# Patient Record
Sex: Female | Born: 1985
Health system: Southern US, Community
[De-identification: ages and names within clinical notes are randomized; demographics above are authoritative.]

## PROBLEM LIST (undated history)

## (undated) DIAGNOSIS — R000 Tachycardia, unspecified: Secondary | ICD-10-CM

## (undated) DIAGNOSIS — J302 Other seasonal allergic rhinitis: Secondary | ICD-10-CM

## (undated) DIAGNOSIS — K589 Irritable bowel syndrome without diarrhea: Secondary | ICD-10-CM

## (undated) DIAGNOSIS — B019 Varicella without complication: Secondary | ICD-10-CM

## (undated) DIAGNOSIS — N809 Endometriosis, unspecified: Secondary | ICD-10-CM

## (undated) DIAGNOSIS — K219 Gastro-esophageal reflux disease without esophagitis: Secondary | ICD-10-CM

## (undated) DIAGNOSIS — G43909 Migraine, unspecified, not intractable, without status migrainosus: Secondary | ICD-10-CM

## (undated) HISTORY — DX: Tachycardia, unspecified: R00.0

## (undated) HISTORY — DX: Gastro-esophageal reflux disease without esophagitis: K21.9

## (undated) HISTORY — DX: Endometriosis, unspecified: N80.9

## (undated) HISTORY — DX: Migraine, unspecified, not intractable, without status migrainosus: G43.909

## (undated) HISTORY — DX: Varicella without complication: B01.9

## (undated) HISTORY — DX: Irritable bowel syndrome, unspecified: K58.9

---

## 2006-01-10 HISTORY — PX: OTHER SURGICAL HISTORY: SHX169

## 2013-10-09 HISTORY — PX: APPENDECTOMY: SHX54

## 2014-07-13 ENCOUNTER — Ambulatory Visit: Payer: Self-pay | Admitting: Physician Assistant

## 2014-07-13 LAB — RAPID STREP-A WITH REFLX: MICRO TEXT REPORT: NEGATIVE

## 2014-07-16 LAB — BETA STREP CULTURE(ARMC)

## 2014-12-13 ENCOUNTER — Ambulatory Visit: Payer: Self-pay | Admitting: Nurse Practitioner

## 2015-01-01 ENCOUNTER — Ambulatory Visit: Payer: Self-pay | Admitting: Urgent Care

## 2015-02-22 NOTE — Discharge Instructions (Signed)

## 2015-02-25 ENCOUNTER — Ambulatory Visit: Payer: BLUE CROSS/BLUE SHIELD | Admitting: Anesthesiology

## 2015-02-25 ENCOUNTER — Other Ambulatory Visit: Payer: Self-pay | Admitting: Gastroenterology

## 2015-02-25 ENCOUNTER — Encounter
Admission: RE | Disposition: A | Discharge status: Home / Self Care | Payer: Self-pay | Source: Ambulatory Visit | Attending: Gastroenterology

## 2015-02-25 ENCOUNTER — Encounter: Payer: Self-pay | Admitting: Gastroenterology

## 2015-02-25 ENCOUNTER — Ambulatory Visit
Admission: RE | Admit: 2015-02-25 | Discharge: 2015-02-25 | Disposition: A | Discharge status: Home / Self Care | Payer: BLUE CROSS/BLUE SHIELD | Source: Ambulatory Visit | Attending: Gastroenterology | Admitting: Gastroenterology

## 2015-02-25 DIAGNOSIS — R51 Headache: Secondary | ICD-10-CM | POA: Diagnosis not present

## 2015-02-25 DIAGNOSIS — K2950 Unspecified chronic gastritis without bleeding: Secondary | ICD-10-CM | POA: Diagnosis not present

## 2015-02-25 DIAGNOSIS — K219 Gastro-esophageal reflux disease without esophagitis: Secondary | ICD-10-CM | POA: Diagnosis not present

## 2015-02-25 DIAGNOSIS — K2980 Duodenitis without bleeding: Secondary | ICD-10-CM | POA: Diagnosis not present

## 2015-02-25 DIAGNOSIS — Z79899 Other long term (current) drug therapy: Secondary | ICD-10-CM | POA: Diagnosis not present

## 2015-02-25 DIAGNOSIS — R110 Nausea: Secondary | ICD-10-CM | POA: Diagnosis present

## 2015-02-25 HISTORY — DX: Other seasonal allergic rhinitis: J30.2

## 2015-02-25 HISTORY — DX: Gastro-esophageal reflux disease without esophagitis: K21.9

## 2015-02-25 HISTORY — PX: ESOPHAGOGASTRODUODENOSCOPY: SHX5428

## 2015-02-25 SURGERY — COLONOSCOPY
Anesthesia: Choice

## 2015-02-25 SURGERY — EGD (ESOPHAGOGASTRODUODENOSCOPY)
Anesthesia: Monitor Anesthesia Care | Wound class: Clean Contaminated

## 2015-02-25 MED ORDER — OXYCODONE HCL 5 MG/5ML PO SOLN: 5.0000 mg | Freq: Once | ORAL | Status: DC | PRN

## 2015-02-25 MED ORDER — OXYCODONE HCL 5 MG PO TABS: 5.0000 mg | ORAL_TABLET | Freq: Once | ORAL | Status: DC | PRN

## 2015-02-25 MED ORDER — DEXAMETHASONE SODIUM PHOSPHATE 4 MG/ML IJ SOLN: 8.0000 mg | Freq: Once | INTRAMUSCULAR | Status: DC | PRN

## 2015-02-25 MED ORDER — LIDOCAINE HCL (CARDIAC) 20 MG/ML IV SOLN
INTRAVENOUS | Status: DC | PRN
  Administered 2015-02-25: 50 mg via INTRAVENOUS

## 2015-02-25 MED ORDER — FENTANYL CITRATE (PF) 100 MCG/2ML IJ SOLN: 25.0000 ug | INTRAMUSCULAR | Status: DC | PRN

## 2015-02-25 MED ORDER — PROPOFOL 10 MG/ML IV BOLUS
INTRAVENOUS | Status: DC | PRN
  Administered 2015-02-25: 100 mg via INTRAVENOUS
  Administered 2015-02-25 (×2): 50 mg via INTRAVENOUS

## 2015-02-25 MED ORDER — ACETAMINOPHEN 325 MG PO TABS: 325.0000 mg | ORAL_TABLET | ORAL | Status: DC | PRN

## 2015-02-25 MED ORDER — LACTATED RINGERS IV SOLN
INTRAVENOUS | Status: DC
  Administered 2015-02-25: 08:00:00 via INTRAVENOUS

## 2015-02-25 MED ORDER — SODIUM CHLORIDE 0.9 % IV SOLN: INTRAVENOUS | Status: DC

## 2015-02-25 MED ORDER — ACETAMINOPHEN 160 MG/5ML PO SOLN
325.0000 mg | ORAL | Status: DC | PRN
Start: 2015-02-25 — End: 2015-02-25

## 2015-02-25 SURGICAL SUPPLY — 38 items
BALLN DILATOR 10-12 8 (BALLOONS)
BALLN DILATOR 12-15 8 (BALLOONS)
BALLN DILATOR 15-18 8 (BALLOONS)
BALLN DILATOR CRE 0-12 8 (BALLOONS)
BALLN DILATOR ESOPH 8 10 CRE (MISCELLANEOUS) IMPLANT
BALLOON DILATOR 12-15 8 (BALLOONS) IMPLANT
BALLOON DILATOR 15-18 8 (BALLOONS) IMPLANT
BALLOON DILATOR CRE 0-12 8 (BALLOONS) IMPLANT
BLOCK BITE 60FR ADLT L/F GRN (MISCELLANEOUS) ×2 IMPLANT
CANISTER SUCT 1200ML W/VALVE (MISCELLANEOUS) ×2 IMPLANT
FCP ESCP3.2XJMB 240X2.8X (MISCELLANEOUS)
FORCEPS BIOP RAD 4 LRG CAP 4 (CUTTING FORCEPS) ×2 IMPLANT
FORCEPS BIOP RJ4 240 W/NDL (MISCELLANEOUS)
FORCEPS ESCP3.2XJMB 240X2.8X (MISCELLANEOUS) IMPLANT
GOWN CVR UNV OPN BCK APRN NK (MISCELLANEOUS) ×2 IMPLANT
GOWN ISOL THUMB LOOP REG UNIV (MISCELLANEOUS) ×2
HEMOCLIP INSTINCT (CLIP) IMPLANT
INJECTOR VARIJECT VIN23 (MISCELLANEOUS) IMPLANT
KIT CO2 TUBING (TUBING) IMPLANT
KIT DEFENDO VALVE AND CONN (KITS) IMPLANT
KIT ENDO PROCEDURE OLY (KITS) ×2 IMPLANT
LIGATOR MULTIBAND 6SHOOTER MBL (MISCELLANEOUS) IMPLANT
MARKER SPOT ENDO TATTOO 5ML (MISCELLANEOUS) IMPLANT
PAD GROUND ADULT SPLIT (MISCELLANEOUS) IMPLANT
SNARE SHORT THROW 13M SML OVAL (MISCELLANEOUS) IMPLANT
SNARE SHORT THROW 30M LRG OVAL (MISCELLANEOUS) IMPLANT
SPOT EX ENDOSCOPIC TATTOO (MISCELLANEOUS)
SUCTION POLY TRAP 4CHAMBER (MISCELLANEOUS) IMPLANT
SYR INFLATION 60ML (SYRINGE) IMPLANT
TRAP SUCTION POLY (MISCELLANEOUS) IMPLANT
TUBING CONN 6MMX3.1M (TUBING)
TUBING SUCTION CONN 0.25 STRL (TUBING) IMPLANT
UNDERPAD 30X60 958B10 (PK) (MISCELLANEOUS) IMPLANT
VALVE BIOPSY ENDO (VALVE) IMPLANT
VARIJECT INJECTOR VIN23 (MISCELLANEOUS)
WATER AUXILLARY (MISCELLANEOUS) IMPLANT
WATER STERILE IRR 500ML POUR (IV SOLUTION) ×2 IMPLANT
WIRE CRE 18-20MM 8CM F G (MISCELLANEOUS) IMPLANT

## 2015-02-25 NOTE — Anesthesia Procedure Notes (Signed)
Procedure Name: MAC Performed by: Celise Bazar Pre-anesthesia Checklist: Patient identified, Emergency Drugs available, Suction available, Timeout performed and Patient being monitored Patient Re-evaluated:Patient Re-evaluated prior to inductionOxygen Delivery Method: Nasal cannula Placement Confirmation: positive ETCO2     

## 2015-02-25 NOTE — Anesthesia Preprocedure Evaluation (Addendum)
Anesthesia Evaluation  Patient identified by MRN, date of birth, ID band Patient awake    Reviewed: Allergy & Precautions, H&P , NPO status , Patient's Chart, lab work & pertinent test results, reviewed documented beta blocker date and time   Airway Mallampati: II  TM Distance: >3 FB Neck ROM: full    Dental  (+) Chipped Chipped front upper left tooth:   Pulmonary neg pulmonary ROS,  breath sounds clear to auscultation  Pulmonary exam normal       Cardiovascular Exercise Tolerance: Good negative cardio ROS  Rhythm:regular Rate:Normal     Neuro/Psych  Headaches, negative psych ROS   GI/Hepatic Neg liver ROS, GERD-  Medicated and Controlled,  Endo/Other  negative endocrine ROS  Renal/GU negative Renal ROS  negative genitourinary   Musculoskeletal   Abdominal   Peds  Hematology negative hematology ROS (+)   Anesthesia Other Findings   Reproductive/Obstetrics negative OB ROS                            Anesthesia Physical Anesthesia Plan  ASA: II  Anesthesia Plan: MAC   Post-op Pain Management:    Induction:   Airway Management Planned:   Additional Equipment:   Intra-op Plan:   Post-operative Plan:   Informed Consent: I have reviewed the patients History and Physical, chart, labs and discussed the procedure including the risks, benefits and alternatives for the proposed anesthesia with the patient or authorized representative who has indicated his/her understanding and acceptance.   Dental Advisory Given  Plan Discussed with: CRNA  Anesthesia Plan Comments:         Anesthesia Quick Evaluation

## 2015-02-25 NOTE — Op Note (Signed)
[facility] Gastroenterology Patient Name: Megan StandsMeris Knippenberg Procedure Date: 02/25/2015 8:35 AM MRN: [id number] Account #: [id number] Date of Birth: 04/02/1986 Admit Type: Outpatient Age: [age] [facility] Gender: Female Note Status: Finalized Procedure:         Upper GI endoscopy Indications:       Epigastric abdominal pain, Dyspepsia, Heartburn Providers:         Midge Miniumarren Tisha Cline, MD Referring MD:      Barbette Merinorystal M. King (Referring MD) Medicines:         Propofol per Anesthesia Complications:     No immediate complications. Procedure:         Pre-Anesthesia Assessment:                    - Prior to the procedure, a History and Physical was                     performed, and patient medications and allergies were                     reviewed. The patient's tolerance of previous anesthesia                     was also reviewed. The risks and benefits of the procedure                     and the sedation options and risks were discussed with the                     patient. All questions were answered, and informed consent                     was obtained. Prior Anticoagulants: The patient has taken                     no previous anticoagulant or antiplatelet agents. ASA                     Grade Assessment: II - A patient with mild systemic                     disease. After reviewing the risks and benefits, the                     patient was deemed in satisfactory condition to undergo                     the procedure.                    After obtaining informed consent, the endoscope was passed                     under direct vision. Throughout the procedure, the                     patient's blood pressure, pulse, and oxygen saturations                     were monitored continuously. The Olympus GIF-HQ190                     Endoscope (S#. [id number]) was introduced through the mouth,  and advanced to the second part of duodenum. The upper GI                   endoscopy was accomplished without difficulty. The patient                     tolerated the procedure well. Findings:      The examined esophagus was normal.      The entire examined stomach was normal. Biopsies were taken with a cold       forceps for Helicobacter pylori testing.      Localized mild inflammation characterized by erosions was found in the       duodenal bulb. Biopsies were taken with a cold forceps for histology. Impression:        - Normal esophagus.                    - Normal stomach. Biopsied.                    - Duodenitis. Biopsied. Recommendation:    - Await pathology results. Procedure Code(s): --- Professional ---                    [phone], Esophagogastroduodenoscopy, flexible, transoral;                     with biopsy, single or multiple Diagnosis Code(s): --- Professional ---                    R10.13, Epigastric pain                    R12, Heartburn                    K29.80, Duodenitis without bleeding CPT copyright 2014 American Medical Association. All rights reserved. The codes documented in this report are preliminary and upon coder review may  be revised to meet current compliance requirements. Midge Miniumarren Alyxandria Wentz, MD 02/25/2015 8:55:17 AM This report has been signed electronically. Number of Addenda: 0 Note Initiated On: 02/25/2015 8:35 AM Total Procedure Duration: 0 hours 2 minutes 28 seconds       [facility]

## 2015-02-25 NOTE — H&P (Signed)
  [facility]  [address] [place], [place] [address] Phone: [phone] Fax : [phone]  Primary Care Physician:  No PCP Per Patient Primary Gastroenterologist:  Dr. Servando SnareWohl  Pre-Procedure History & Physical: HPI:  Megan Norton is a 29 y.o. female is here for an endoscopy.   Past Medical History  Diagnosis Date  . Abdominal pain     Colicky RUQ  . Nausea   . GERD (gastroesophageal reflux disease)     heartburn and indigestion  . Constipation   . Diarrhea   . Headache     hx of migraines/ not as often now/ headaches 2-3 times/week  . Seasonal allergies     Past Surgical History  Procedure Laterality Date  . Appendectomy  10/09/2013  . Bowel obstruction  01/2006    Prior to Admission medications   Medication Sig Start Date End Date Taking? Authorizing Provider  omeprazole (PRILOSEC) 20 MG capsule Take 20 mg by mouth daily. Before breakfast   Yes Historical Provider, MD  ondansetron (ZOFRAN) 8 MG tablet Take by mouth every 8 (eight) hours as needed for nausea or vomiting.   Yes Historical Provider, MD  ranitidine (ZANTAC) 75 MG tablet Take 75 mg by mouth as needed for heartburn.   Yes Historical Provider, MD    Allergies as of 02/15/2015  . (Not on File)    History reviewed. No pertinent family history.  History   Social History  . Marital Status: Married    Spouse Name: N/A  . Number of Children: N/A  . Years of Education: N/A   Occupational History  . Not on file.   Social History Main Topics  . Smoking status: Never Smoker   . Smokeless tobacco: Not on file  . Alcohol Use: No  . Drug Use: No  . Sexual Activity: Not on file   Other Topics Concern  . Not on file   Social History Narrative  . No narrative on file    Review of Systems: See HPI, otherwise negative ROS  Physical Exam: BP 112/74 mmHg  Pulse 73  Temp(Src) 98.1 F (36.7 C) (Temporal)  Resp 16  Ht 5\' 3"  (1.6 m)  Wt 218 lb (98.884 kg)  BMI 38.63 kg/m2   SpO2 100%  LMP 02/14/2015 General:   Alert,  pleasant and cooperative in NAD Head:  Normocephalic and atraumatic. Neck:  Supple; no masses or thyromegaly. Lungs:  Clear throughout to auscultation.    Heart:  Regular rate and rhythm. Abdomen:  Soft, nontender and nondistended. Normal bowel sounds, without guarding, and without rebound.   Neurologic:  Alert and  oriented x4;  grossly normal neurologically.  Impression/Plan: Megan Norton is here for an endoscopy to be performed for GERD, nausea and epigastric pain.  Risks, benefits, limitations, and alternatives regarding  endoscopy have been reviewed with the patient.  Questions have been answered.  All parties agreeable.   [facility],Sami Roes, MD  02/25/2015, 8:39 AM

## 2015-02-25 NOTE — Transfer of Care (Signed)
Immediate Anesthesia Transfer of Care Note  Patient: Megan Norton  Procedure(s) Performed: Procedure(s): ESOPHAGOGASTRODUODENOSCOPY (EGD) (N/A)  Patient Location: PACU  Anesthesia Type: MAC  Level of Consciousness: awake, alert  and patient cooperative  Airway and Oxygen Therapy: Patient Spontanous Breathing and Patient connected to supplemental oxygen  Post-op Assessment: Post-op Vital signs reviewed, Patient's Cardiovascular Status Stable, Respiratory Function Stable, Patent Airway and No signs of Nausea or vomiting  Post-op Vital Signs: Reviewed and stable  Complications: No apparent anesthesia complications

## 2015-02-25 NOTE — Anesthesia Postprocedure Evaluation (Signed)
  Anesthesia Post-op Note  Patient: Megan Norton  Procedure(s) Performed: Procedure(s): ESOPHAGOGASTRODUODENOSCOPY (EGD) (N/A)  Anesthesia type:MAC  Patient location: PACU  Post pain: Pain level controlled  Post assessment: Post-op Vital signs reviewed, Patient's Cardiovascular Status Stable, Respiratory Function Stable, Patent Airway and No signs of Nausea or vomiting  Post vital signs: Reviewed and stable  Last Vitals:  Filed Vitals:   02/25/15 0856  BP:   Pulse: 65  Temp: 35.8 C  Resp: 18    Level of consciousness: awake, alert  and patient cooperative  Complications: No apparent anesthesia complications

## 2016-06-19 ENCOUNTER — Encounter: Payer: Self-pay | Admitting: Family Medicine

## 2016-06-19 ENCOUNTER — Ambulatory Visit (INDEPENDENT_AMBULATORY_CARE_PROVIDER_SITE_OTHER): Payer: BLUE CROSS/BLUE SHIELD | Admitting: Family Medicine

## 2016-06-19 ENCOUNTER — Encounter (INDEPENDENT_AMBULATORY_CARE_PROVIDER_SITE_OTHER): Payer: Self-pay

## 2016-06-19 VITALS — BP 121/80 | HR 75 | Temp 98.4°F | Wt 230.5 lb

## 2016-06-19 DIAGNOSIS — R3589 Other polyuria: Secondary | ICD-10-CM

## 2016-06-19 DIAGNOSIS — Z13220 Encounter for screening for lipoid disorders: Secondary | ICD-10-CM | POA: Diagnosis not present

## 2016-06-19 DIAGNOSIS — R358 Other polyuria: Secondary | ICD-10-CM

## 2016-06-19 DIAGNOSIS — R5382 Chronic fatigue, unspecified: Secondary | ICD-10-CM

## 2016-06-19 LAB — POCT URINALYSIS DIPSTICK
Bilirubin, UA: NEGATIVE
Glucose, UA: NEGATIVE
Ketones, UA: NEGATIVE
LEUKOCYTES UA: NEGATIVE
Nitrite, UA: NEGATIVE
PROTEIN UA: NEGATIVE
Spec Grav, UA: <=1.005
UROBILINOGEN UA: 0.2
pH, UA: 6.5

## 2016-06-19 LAB — T4, FREE: Free T4: 1.1 ng/dL (ref 0.8–1.8)

## 2016-06-19 LAB — CBC
HEMATOCRIT: 36.2 % (ref 35.0–45.0)
HEMOGLOBIN: 11.9 g/dL (ref 11.7–15.5)
MCH: 27.7 pg (ref 27.0–33.0)
MCHC: 32.9 g/dL (ref 32.0–36.0)
MCV: 84.4 fL (ref 80.0–100.0)
MPV: 9.5 fL (ref 7.5–12.5)
Platelets: 342 10*3/uL (ref 140–400)
RBC: 4.29 MIL/uL (ref 3.80–5.10)
RDW: 13.6 % (ref 11.0–15.0)
WBC: 7 10*3/uL (ref 3.8–10.8)

## 2016-06-19 LAB — COMPREHENSIVE METABOLIC PANEL
ALBUMIN: 4.6 g/dL (ref 3.6–5.1)
ALT: 17 U/L (ref 6–29)
AST: 16 U/L (ref 10–30)
Alkaline Phosphatase: 54 U/L (ref 33–115)
BUN: 12 mg/dL (ref 7–25)
CHLORIDE: 102 mmol/L (ref 98–110)
CO2: 24 mmol/L (ref 20–31)
Calcium: 9.6 mg/dL (ref 8.6–10.2)
Creat: 0.64 mg/dL (ref 0.50–1.10)
GLUCOSE: 98 mg/dL (ref 65–99)
POTASSIUM: 4 mmol/L (ref 3.5–5.3)
Sodium: 138 mmol/L (ref 135–146)
Total Bilirubin: 0.4 mg/dL (ref 0.2–1.2)
Total Protein: 7.9 g/dL (ref 6.1–8.1)

## 2016-06-19 LAB — SEDIMENTATION RATE: Sed Rate: 8 mm/hr (ref 0–20)

## 2016-06-19 LAB — LIPID PANEL
CHOL/HDL RATIO: 3.9 ratio (ref <=5.0)
CHOLESTEROL: 153 mg/dL (ref 125–200)
HDL: 39 mg/dL — AB (ref >=46)
LDL Cholesterol: 95 mg/dL (ref <130)
TRIGLYCERIDES: 96 mg/dL (ref <150)
VLDL: 19 mg/dL (ref <30)

## 2016-06-19 LAB — TSH: TSH: 4.97 mIU/L — ABNORMAL HIGH

## 2016-06-19 LAB — C-REACTIVE PROTEIN: CRP: 0.5 mg/dL (ref <0.60)

## 2016-06-19 LAB — T3, FREE: T3, Free: 3.2 pg/mL (ref 2.3–4.2)

## 2016-06-19 NOTE — Patient Instructions (Signed)
We will call with your results.  Follow up to be determined based on results.  Take care  Dr. Adriana Simasook

## 2016-06-19 NOTE — Progress Notes (Signed)
Pre visit review using our clinic review tool, if applicable. No additional management support is needed unless otherwise documented below in the visit note. 

## 2016-06-20 LAB — HEMOGLOBIN A1C
Hgb A1c MFr Bld: 5.4 % (ref <5.7)
Mean Plasma Glucose: 108 mg/dL

## 2016-06-21 ENCOUNTER — Encounter: Payer: Self-pay | Admitting: Family Medicine

## 2016-06-21 DIAGNOSIS — R5382 Chronic fatigue, unspecified: Secondary | ICD-10-CM | POA: Insufficient documentation

## 2016-06-21 IMAGING — US US GALLBLADDER-BILIARY (RUQ)
1 series · 14 of 25 positions shown · non-contrast
Comparison: None.

CLINICAL DATA: Several week history of right upper quadrant pain
associated with nausea without vomiting

EXAM:
US ABDOMEN LIMITED - RIGHT UPPER QUADRANT

[Series 1: us gallbladder-biliary (ruq) · 0.26mm/px · 14 of 47 slices shown]
[im 1/47]
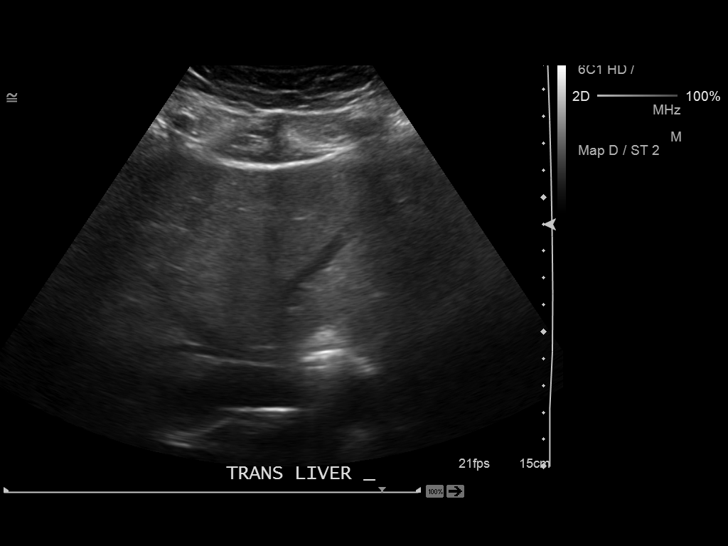
[im 4/47]
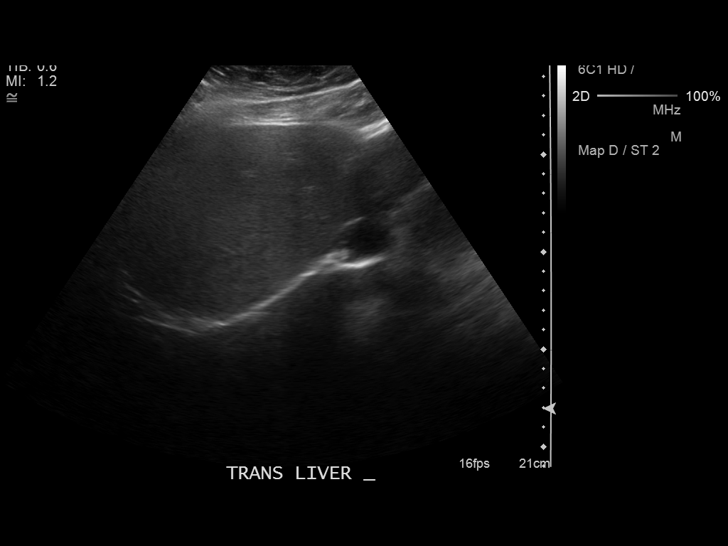
[im 8/47]
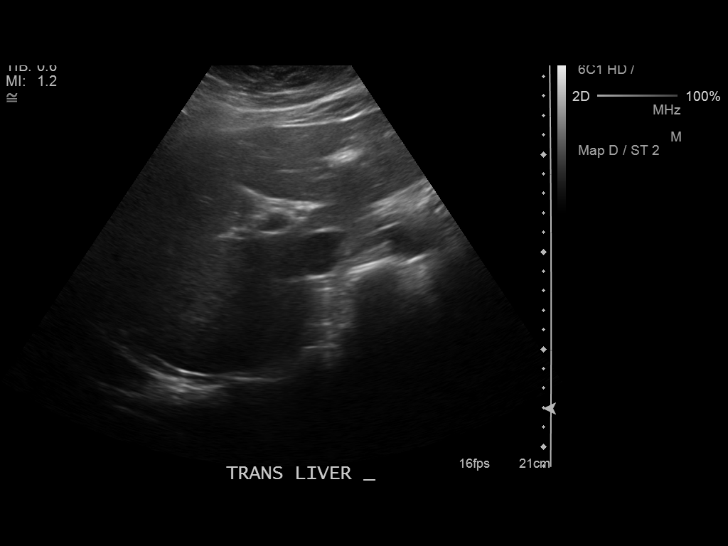
[im 12/47]
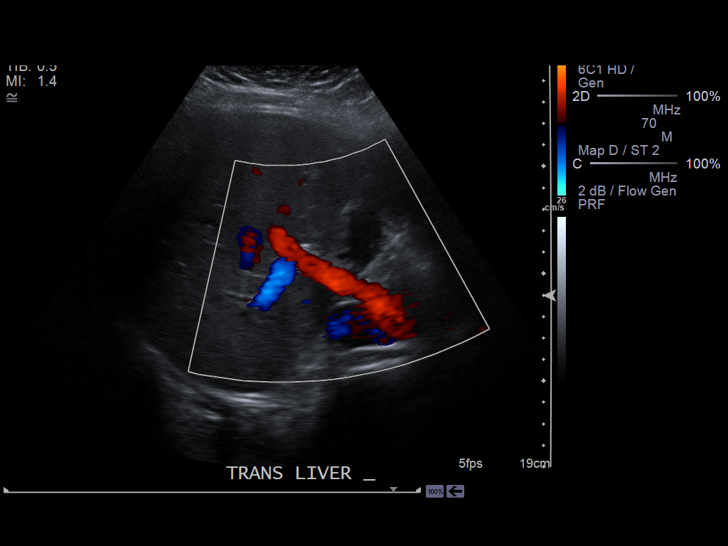
[im 16/47]
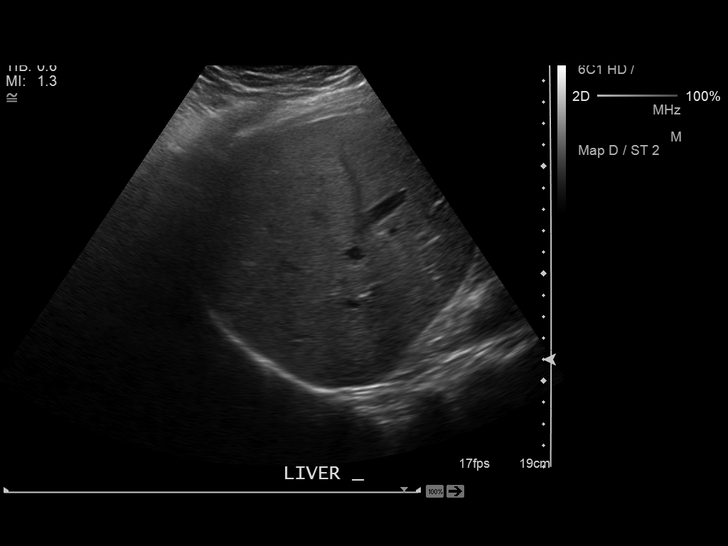
[im 18/47]
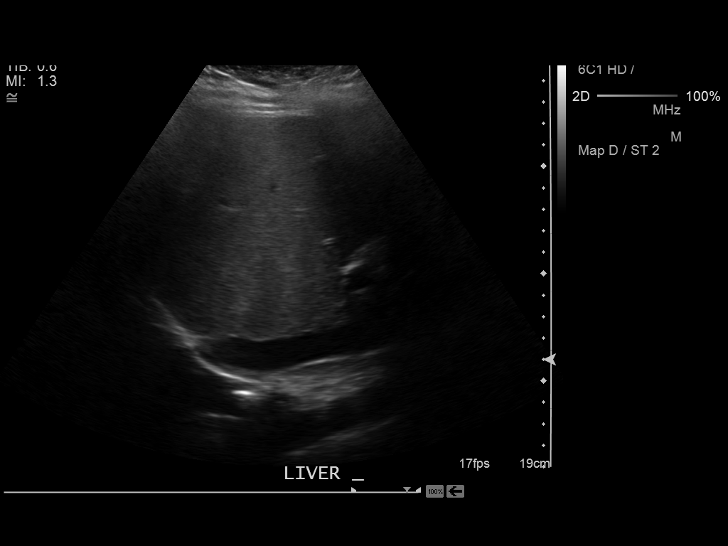
[im 22/47]
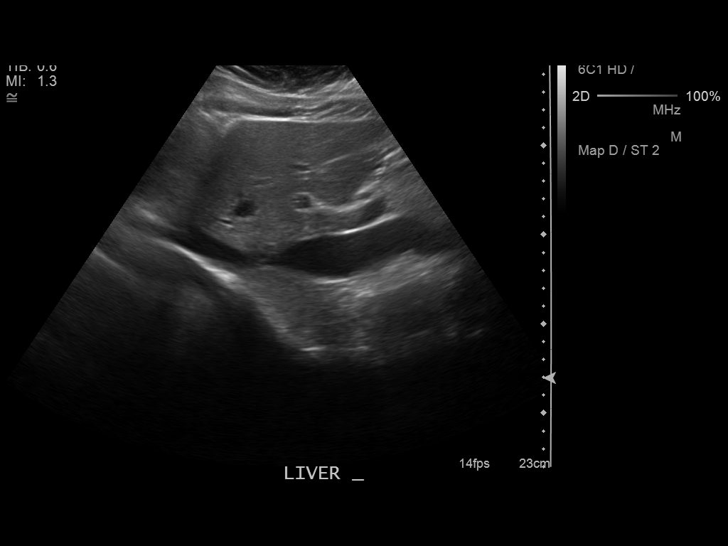
[im 25/47]
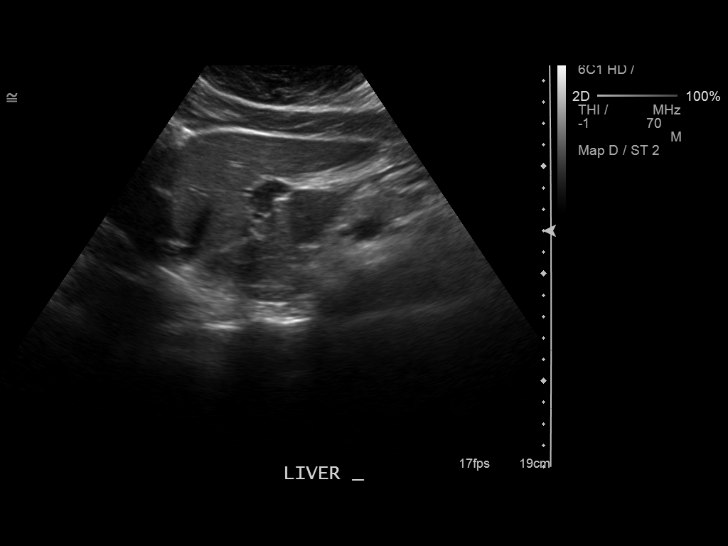
[im 29/47]
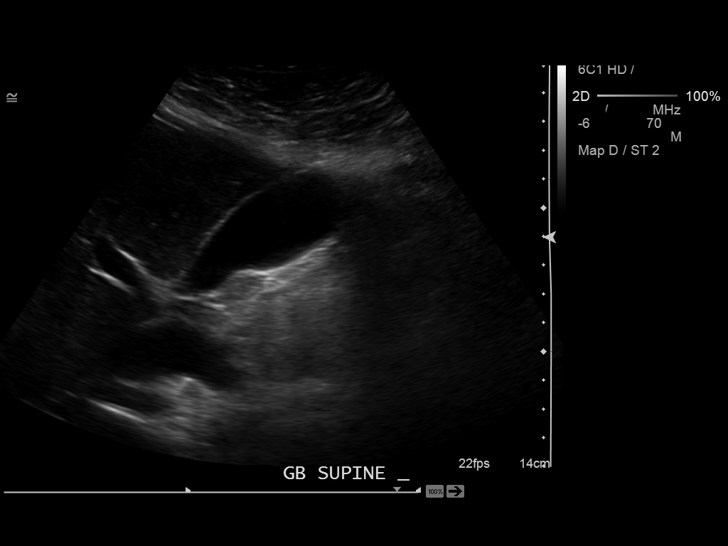
[im 31/47]
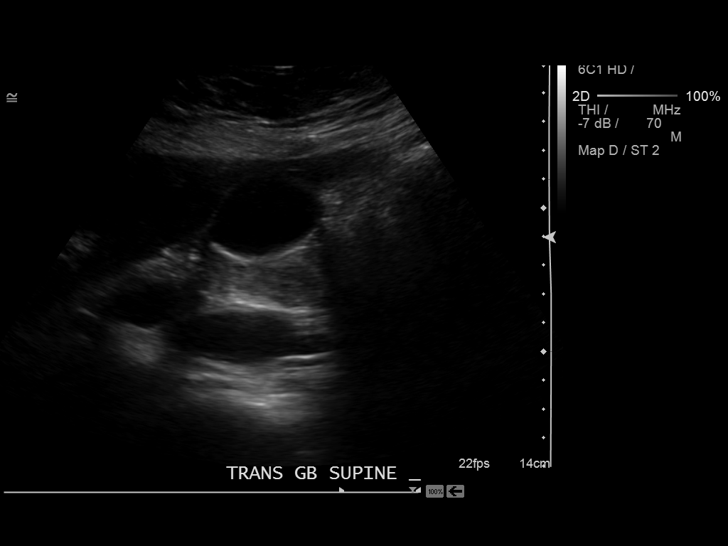
[im 35/47]
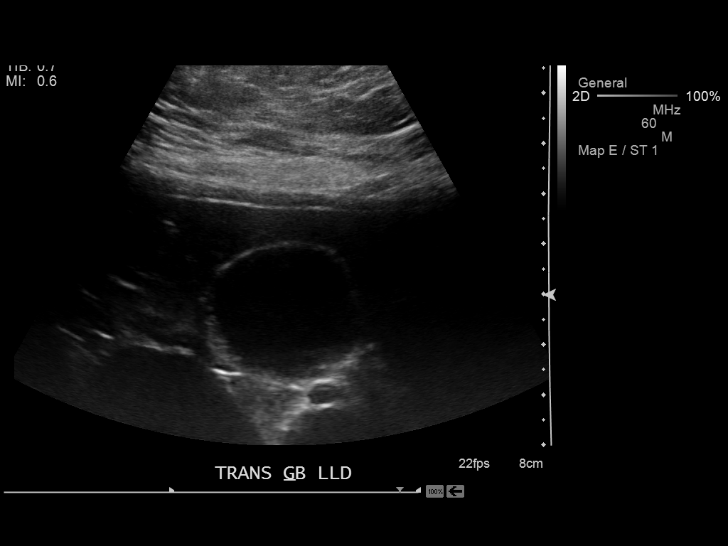
[im 39/47]
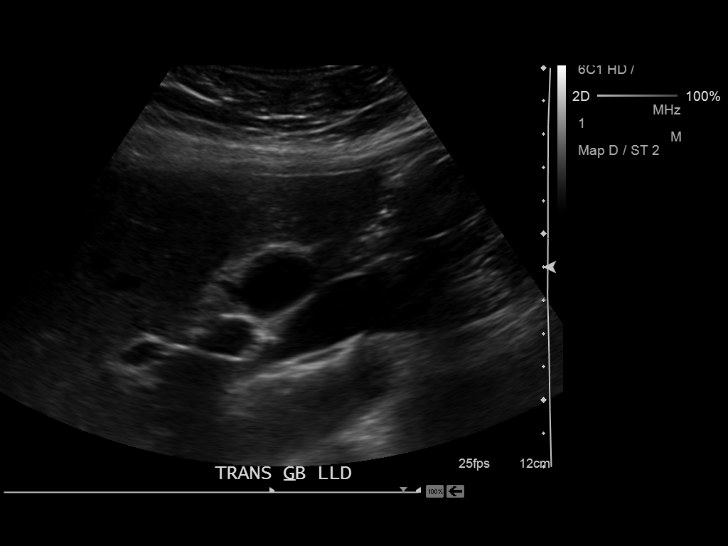
[im 43/47]
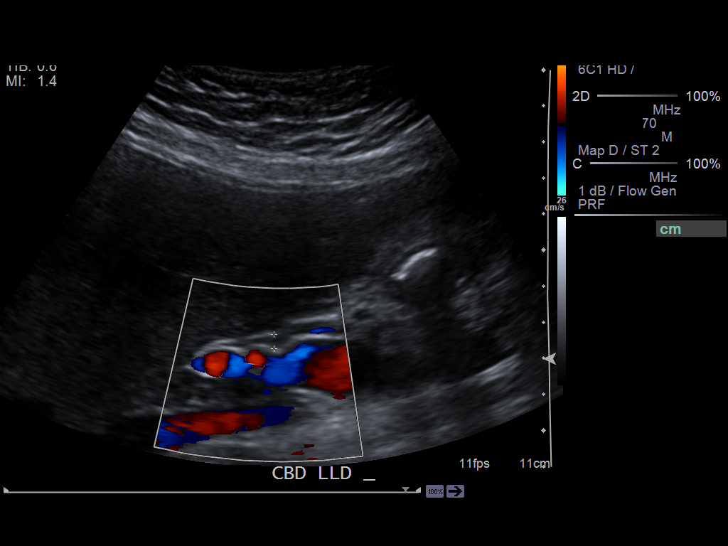
[im 47/47]
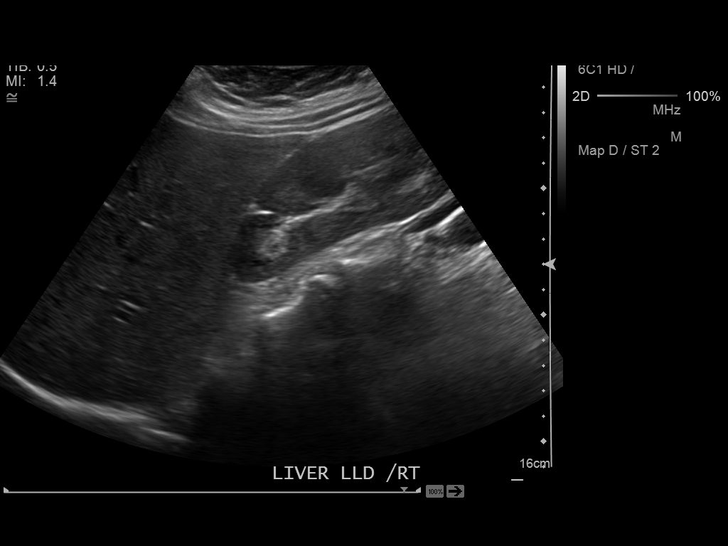

[14 of 25 positions shown; findings below may reference images not displayed]

FINDINGS: Gallbladder:

No gallstones or wall thickening visualized. No sonographic Murphy
sign noted.

Common bile duct:

Diameter: 3.9 mm

Liver:

No focal lesion identified. Within normal limits in parenchymal
echogenicity. There is no intrahepatic ductal dilation.
IMPRESSION: Normal right upper quadrant ultrasound examination.

## 2016-06-21 NOTE — Progress Notes (Signed)
Subjective:  Patient ID: Megan Norton, female    DOB: [birth year]  Age: 30 y.o. MRN: [id number]  CC: New patient - Fatigue  HPI Megan Norton is a 30 y.o. female presents to the clinic today with complaints of fatigue.  Fatigue  New patient.  Patient has a multitude of somatic complaints today.  Her most concerning issue is fatigue.  Patient reports a two-year history of severe/extreme fatigue.  Patient has associated GI symptoms - see review of systems below.  She has seen GI and has had an extensive evaluation which was negative.  She reports weight gain.  She is concerned that her symptoms are from hypothyroidism as she has a family history of this and she looked up her symptoms on line and felt that they were consistent with this.  She states that her mother has hypothyroidism and when she was treated she drastically improved.  No known inciting factor. No known exacerbating or relieving factors.  Patient's additional complaints are in ROS.  PMH, Surgical Hx, Family Hx, Social History reviewed and updated as below.  Past Medical History:  Diagnosis Date  . Chicken pox   . Endometriosis   . GERD (gastroesophageal reflux disease)    heartburn and indigestion  . IBS (irritable bowel syndrome)   . Migraines   . Seasonal allergies    Past Surgical History:  Procedure Laterality Date  . APPENDECTOMY  10/09/2013  . bowel obstruction  01/2006  . ESOPHAGOGASTRODUODENOSCOPY N/A 02/25/2015   Procedure: ESOPHAGOGASTRODUODENOSCOPY (EGD);  Surgeon: Lucilla Lame, MD;  Location: [facility];  Service: Gastroenterology;  Laterality: N/A;   Family History  Problem Relation Age of Onset  . Arthritis Mother   . Glaucoma Mother   . Hypothyroidism Mother   . Arthritis Maternal Grandmother   . Colon cancer Maternal Grandmother   . Arthritis Maternal Grandfather   . Arthritis Paternal Grandmother   . Depression Paternal Grandmother   . Arthritis Paternal Grandfather     . Hypertension Paternal Grandfather    Social History  Substance Use Topics  . Smoking status: Never Smoker  . Smokeless tobacco: Never Used  . Alcohol use No    Review of Systems  Constitutional: Positive for fatigue.  HENT: Positive for tinnitus.   Cardiovascular: Positive for palpitations and leg swelling.  Gastrointestinal: Positive for constipation, diarrhea, nausea and vomiting.  Endocrine: Positive for polydipsia.  Genitourinary:       Frequent urination.  Musculoskeletal: Positive for arthralgias.  Skin:       Hair "falling out". Dry skin.  Neurological: Positive for dizziness, weakness and headaches.  Psychiatric/Behavioral:       Anxiety, memory issues.  All other systems reviewed and are negative.   Objective:   Today's Vitals: BP 121/80 (BP Location: Left Arm, Patient Position: Sitting, Cuff Size: Normal)   Pulse 75   Temp 98.4 F (36.9 C) (Oral)   Wt 230 lb 8 oz (104.6 kg)   SpO2 100%   BMI 40.83 kg/m   Physical Exam  Constitutional: She is oriented to person, place, and time. She appears well-developed. No distress.  HENT:  Head: Normocephalic and atraumatic.  Mouth/Throat: No oropharyngeal exudate.  Eyes: Conjunctivae are normal. No scleral icterus.  Neck: Neck supple.  Cardiovascular: Normal rate and regular rhythm.   Pulmonary/Chest: Effort normal. She has no wheezes. She has no rales.  Abdominal: Soft. She exhibits no distension. There is no tenderness. There is no rebound and no guarding.  Musculoskeletal: Normal range of motion.  She exhibits no edema.  Lymphadenopathy:    She has no cervical adenopathy.  Neurological: She is alert and oriented to person, place, and time.  Skin: Skin is warm and dry. No rash noted.  Psychiatric:  Anxious.  Vitals reviewed.  Assessment & Plan:   Problem List Items Addressed This Visit    Chronic fatigue - Primary    New problem, unclear etiology & prognosis. Suspect component of Depression/anxiety in  setting of obesity. Multiple somatic complaints today. Starting work up today - see lab orders.      Relevant Orders   CBC (Completed)   Comp Met (CMET) (Completed)   TSH (Completed)   T4, free (Completed)   T3, free (Completed)   HgB A1c (Completed)   Sed Rate (ESR) (Completed)   C-reactive protein (Completed)    Other Visit Diagnoses    Screening for hyperlipidemia       Relevant Orders   Lipid Profile (Completed)   Polyuria       Relevant Orders   POCT Urinalysis Dipstick (Completed)      Outpatient Encounter Prescriptions as of 06/19/2016  Medication Sig  . norethindrone (AYGESTIN) 5 MG tablet   . ondansetron (ZOFRAN) 8 MG tablet Take by mouth every 8 (eight) hours as needed for nausea or vomiting.  . ranitidine (ZANTAC) 75 MG tablet Take 75 mg by mouth as needed for heartburn.  . [DISCONTINUED] omeprazole (PRILOSEC) 20 MG capsule Take 20 mg by mouth daily. Before breakfast   No facility-administered encounter medications on file as of 06/19/2016.     Follow-up: PRN  [place]

## 2016-06-21 NOTE — Assessment & Plan Note (Addendum)
New problem, unclear etiology & prognosis. Suspect component of Depression/anxiety in setting of obesity. Multiple somatic complaints today. Starting work up today - see lab orders.

## 2016-06-22 ENCOUNTER — Telehealth: Payer: Self-pay | Admitting: *Deleted

## 2016-06-22 ENCOUNTER — Telehealth: Payer: Self-pay | Admitting: Family Medicine

## 2016-06-22 NOTE — Telephone Encounter (Signed)
Pt requested lab results  Pt [phone]

## 2016-06-22 NOTE — Telephone Encounter (Signed)
Another voicemail was left.

## 2016-06-22 NOTE — Telephone Encounter (Signed)
Pt returned your call Best number [phone]

## 2016-06-22 NOTE — Telephone Encounter (Signed)
Megan Norton spoke with pt and gave lab results.

## 2016-06-22 NOTE — Telephone Encounter (Signed)
SPoke with patient see result note, thanks

## 2016-08-03 ENCOUNTER — Other Ambulatory Visit: Payer: Self-pay | Admitting: Family Medicine

## 2016-08-03 ENCOUNTER — Telehealth: Payer: Self-pay

## 2016-08-03 DIAGNOSIS — R7989 Other specified abnormal findings of blood chemistry: Secondary | ICD-10-CM

## 2016-08-03 NOTE — Telephone Encounter (Signed)
Pt coming for repeat labs 08/04/16. Please place future orders. Thank you.

## 2016-08-03 NOTE — Telephone Encounter (Signed)
Orders in 

## 2016-08-04 ENCOUNTER — Other Ambulatory Visit (INDEPENDENT_AMBULATORY_CARE_PROVIDER_SITE_OTHER): Payer: BLUE CROSS/BLUE SHIELD

## 2016-08-04 DIAGNOSIS — R946 Abnormal results of thyroid function studies: Secondary | ICD-10-CM

## 2016-08-04 DIAGNOSIS — R7989 Other specified abnormal findings of blood chemistry: Secondary | ICD-10-CM

## 2016-08-04 LAB — T4, FREE: FREE T4: 0.82 ng/dL (ref 0.60–1.60)

## 2016-08-04 LAB — T3, FREE: T3 FREE: 3.7 pg/mL (ref 2.3–4.2)

## 2016-08-04 LAB — TSH: TSH: 2.73 u[IU]/mL (ref 0.35–4.50)

## 2016-12-11 ENCOUNTER — Telehealth: Payer: Self-pay

## 2016-12-11 NOTE — Telephone Encounter (Signed)
Pt has not been seen since 09/2015. Please schedule appointment w/ AMS

## 2016-12-11 NOTE — Telephone Encounter (Signed)
Pt called stating she ran out of norethindrone for endometroises in Jan. She doesn't know if she needs to come in or if AMS can call it in.  The pain is back really bad.

## 2016-12-14 NOTE — Telephone Encounter (Signed)
Pt calling again. [phone].

## 2016-12-14 NOTE — Telephone Encounter (Signed)
Pt is scheduled with AMS for follow up on 3/6.

## 2016-12-15 ENCOUNTER — Encounter: Payer: Self-pay | Admitting: Obstetrics and Gynecology

## 2016-12-15 ENCOUNTER — Ambulatory Visit (INDEPENDENT_AMBULATORY_CARE_PROVIDER_SITE_OTHER): Payer: BLUE CROSS/BLUE SHIELD | Admitting: Obstetrics and Gynecology

## 2016-12-15 VITALS — BP 118/76 | HR 86 | Ht 63.0 in | Wt 206.0 lb

## 2016-12-15 DIAGNOSIS — N809 Endometriosis, unspecified: Secondary | ICD-10-CM

## 2016-12-15 DIAGNOSIS — R102 Pelvic and perineal pain: Secondary | ICD-10-CM | POA: Diagnosis not present

## 2016-12-15 MED ORDER — NORETHINDRONE ACETATE 5 MG PO TABS: 5.0000 mg | ORAL_TABLET | Freq: Every day | ORAL | 11 refills | Status: DC

## 2016-12-15 NOTE — Progress Notes (Signed)
Gynecology Pelvic Pain Evaluation   Chief Complaint:  Chief Complaint  Patient presents with  . Endometriosis  . Medication Refill  . Pelvic Pain    History of Present Illness:   Patient is a 31 y.o. Patient's last menstrual period was 12/08/2016., presents today for a problem visit.  She complains of worsening of her chornic pain after discontinuing norethindrone 2 months ago..   Her pain is localized to the suprapubic and deep pelvis area, described as intermittent, began a week ago and its severity is described as moderate. The pain radiates to the  Non-radiating. She has these associated symptoms which include dyspareunia. Patient has these modifiers which include cessation of menstrual cycle that make it better and pain worsened a few days before menses that make it worse.    Previous evaluation: treated for endometriosis. Prior Diagnosis: endometriosis. Previous Treatment: norethindrone 5mg  po daily.  Review of Systems: Review of Systems  Constitutional: Negative for weight loss.  Gastrointestinal: Positive for abdominal pain. Negative for constipation, diarrhea, nausea and vomiting.  Genitourinary: Negative for dysuria.    Past Medical History:  Past Medical History:  Diagnosis Date  . Chicken pox   . Endometriosis   . GERD (gastroesophageal reflux disease)    heartburn and indigestion  . IBS (irritable bowel syndrome)   . Migraines   . Seasonal allergies     Past Surgical History:  Past Surgical History:  Procedure Laterality Date  . APPENDECTOMY  10/09/2013  . bowel obstruction  01/2006  . ESOPHAGOGASTRODUODENOSCOPY N/A 02/25/2015   Procedure: ESOPHAGOGASTRODUODENOSCOPY (EGD);  Surgeon: Midge Minium, MD;  [facility];  Service: Gastroenterology;  Laterality: N/A;    Gynecologic History:  Patient's last menstrual period was 12/08/2016. She is single partner, contraception - norethindrone.  Contraception: norethindrone. Hx of STDs:  none. History of domestic or sexual abuse:  no Last Pap: 10/09/2013. Results were: normal   Obstetric History: No obstetric history on file.  Family History:  Family History  Problem Relation Age of Onset  . Arthritis Mother   . Glaucoma Mother   . Hypothyroidism Mother   . Arthritis Maternal Grandmother   . Colon cancer Maternal Grandmother   . Arthritis Maternal Grandfather   . Arthritis Paternal Grandmother   . Depression Paternal Grandmother   . Arthritis Paternal Grandfather   . Hypertension Paternal Grandfather     Social History:  Social History   Social History  . Marital status: Married    Spouse name: N/A  . Number of children: N/A  . Years of education: N/A   Occupational History  . Not on file.   Social History Main Topics  . Smoking status: Never Smoker  . Smokeless tobacco: Never Used  . Alcohol use No  . Drug use: No  . Sexual activity: Yes    Partners: Male   Other Topics Concern  . Not on file   Social History Narrative  . No narrative on file    Allergies:  Allergies  Allergen Reactions  . Cephalosporins Hives and Rash    Medications: Prior to Admission medications   Not on File    Physical Exam Vitals: Blood pressure 118/76, pulse 86, height 5\' 3"  (1.6 m), weight 206 lb (93.4 kg), last menstrual period 12/08/2016.  General: NAD HEENT: normocephalic, anicteric Pulmonary: No increased work of breathing Abdomen: NABS, soft, non-tender, non-distended.  Umbilicus without lesions.  No hepatomegaly, splenomegaly or masses palpable. No evidence of hernia  Genitourinary:  External:  Normal external female genitalia.  Normal urethral meatus, normal  Bartholin's and Skene's glands.    Vagina: Normal vaginal mucosa, no evidence of prolapse.    Cervix: Grossly normal in appearance, no bleeding  Uterus: Non-enlarged, mobile, normal contour.  No CMT  Adnexa: ovaries non-enlarged, no adnexal masses  Rectal: deferred  Lymphatic: no evidence  of inguinal lymphadenopathy Extremities: no edema, erythema, or tenderness Neurologic: Grossly intact Psychiatric: mood appropriate, affect full  Female chaperone present for pelvic portion of the physical exam  Assessment: 31 y.o. No obstetric history on file. with endometriosis.   No problem-specific Assessment & Plan notes found for this encounter.  Problem List Items Addressed This Visit    None    Visit Diagnoses    Pelvic pain    -  Primary   Endometriosis           - restart norethindrone - we discussed adding neurontin if fails to respond adequately or trial of GnRH agonist - assess response in 3 months at time of annual is due for pap

## 2016-12-15 NOTE — Patient Instructions (Signed)
Endometriosis Endometriosis is a condition in which the tissue that lines the uterus (endometrium) grows outside of its normal location. The tissue may grow in many locations close to the uterus, but it commonly grows on the ovaries, fallopian tubes, vagina, or bowel. When the uterus sheds the endometrium every menstrual cycle, there is bleeding wherever the endometrial tissue is located. This can cause pain because blood is irritating to tissues that are not normally exposed to it. What are the causes? The cause of endometriosis is not known. What increases the risk? You may be more likely to develop endometriosis if you:  Have a family history of endometriosis.  Have never given birth.  Started your period at age 10 or younger.  Have high levels of estrogen in your body.  Were exposed to a certain medicine (diethylstilbestrol) before you were born (in utero).  Had low birth weight.  Were born as a twin, triplet, or other multiple.  Have a BMI of less than 25. BMI is an estimate of body fat and is calculated from height and weight. What are the signs or symptoms? Often, there are no symptoms of this condition. If you do have symptoms, they may:  Vary depending on where your endometrial tissue is growing.  Occur during your menstrual period (most common) or midcycle.  Come and go, or you may go months with no symptoms at all.  Stop with menopause. Symptoms may include:  Pain in the back or abdomen.  Heavier bleeding during periods.  Pain during sex.  Painful bowel movements.  Infertility.  Pelvic pain.  Bleeding more than once a month. How is this diagnosed? This condition is diagnosed based on your symptoms and a physical exam. You may have tests, such as:  Blood tests and urine tests. These may be done to help rule out other possible causes of your symptoms.  Ultrasound, to look for abnormal tissues.  An X-ray of the lower bowel (barium enema).  An ultrasound  that is done through the vagina (transvaginally).  CT scan.  MRI.  Laparoscopy. In this procedure, a lighted, pencil-sized instrument called a laparoscope is inserted into your abdomen through an incision. The laparoscope allows your health care provider to look at the organs inside your body and check for abnormal tissue to confirm the diagnosis. If abnormal tissue is found, your health care provider may remove a small piece of tissue (biopsy) to be examined under a microscope. How is this treated? Treatment for this condition may include:  Medicines to relieve pain, such as NSAIDs.  Hormone therapy. This involves using artificial (synthetic) hormones to reduce endometrial tissue growth. Your health care provider may recommend using a hormonal form of birth control, or other medicines.  Surgery. This may be done to remove abnormal endometrial tissue.  In some cases, tissue may be removed using a laparoscope and a laser (laparoscopic laser treatment).  In severe cases, surgery may be done to remove the fallopian tubes, uterus, and ovaries (hysterectomy). Follow these instructions at home:  Take over-the-counter and prescription medicines only as told by your health care provider.  Do not drive or use heavy machinery while taking prescription pain medicine.  Try to avoid activities that cause pain, including sexual activity.  Keep all follow-up visits as told by your health care provider. This is important. Contact a health care provider if:  You have pain in the area between your hip bones (pelvic area) that occurs:  Before, during, or after your period.  In   between your period and gets worse during your period.  During or after sex.  With bowel movements or urination, especially during your period.  You have problems getting pregnant.  You have a fever. Get help right away if:  You have severe pain that does not get better with medicine.  You have severe nausea and  vomiting, or you cannot eat without vomiting.  You have pain that affects only the lower, right side of your abdomen.  You have abdominal pain that gets worse.  You have abdominal swelling.  You have blood in your stool. This information is not intended to replace advice given to you by your health care provider. Make sure you discuss any questions you have with your health care provider. Document Released: 09/25/2000 Document Revised: 07/03/2016 Document Reviewed: 02/29/2016 Elsevier Interactive Patient Education  2017 Elsevier Inc.  

## 2017-03-22 ENCOUNTER — Ambulatory Visit: Payer: BLUE CROSS/BLUE SHIELD | Admitting: Obstetrics and Gynecology

## 2017-04-08 ENCOUNTER — Ambulatory Visit: Payer: BLUE CROSS/BLUE SHIELD | Admitting: Obstetrics and Gynecology

## 2017-11-22 ENCOUNTER — Encounter: Payer: Self-pay | Admitting: Obstetrics and Gynecology

## 2017-11-22 ENCOUNTER — Ambulatory Visit (INDEPENDENT_AMBULATORY_CARE_PROVIDER_SITE_OTHER): Payer: BLUE CROSS/BLUE SHIELD | Admitting: Obstetrics and Gynecology

## 2017-11-22 ENCOUNTER — Other Ambulatory Visit: Payer: Self-pay

## 2017-11-22 DIAGNOSIS — Z124 Encounter for screening for malignant neoplasm of cervix: Secondary | ICD-10-CM

## 2017-11-22 DIAGNOSIS — Z01419 Encounter for gynecological examination (general) (routine) without abnormal findings: Secondary | ICD-10-CM | POA: Diagnosis not present

## 2017-11-22 DIAGNOSIS — Z1239 Encounter for other screening for malignant neoplasm of breast: Secondary | ICD-10-CM

## 2017-11-22 DIAGNOSIS — Z1231 Encounter for screening mammogram for malignant neoplasm of breast: Secondary | ICD-10-CM | POA: Diagnosis not present

## 2017-11-22 MED ORDER — NORETHINDRONE ACETATE 5 MG PO TABS: 5.0000 mg | ORAL_TABLET | Freq: Every day | ORAL | 11 refills | Status: DC

## 2017-11-22 MED ORDER — NORETHINDRONE ACETATE 5 MG PO TABS: 5.0000 mg | ORAL_TABLET | Freq: Every day | ORAL | 3 refills | Status: DC

## 2017-11-22 NOTE — Progress Notes (Signed)
Patient ID: Megan Norton, female   DOB: [birth year], 32 y.o.   MRN: [id number]     Gynecology Annual Exam   PCP: Tommie Sams, DO  Chief Complaint:  Chief Complaint  Patient presents with  . Gynecologic Exam    Refill on ocp    History of Present Illness: Patient is a 32 y.o. G2P2002 presents for annual exam. The patient has no complaints today.   LMP: No LMP recorded. Amenorrhea on norethindrone for endometriosis.  Endometriosis symptoms well controlled.   The patient is sexually active. She currently uses oral progesterone-only contraceptive for contraception. She denies dyspareunia.  The patient does perform self breast exams.  There is no notable family history of breast or ovarian cancer in her family.  The patient wears seatbelts: yes.   The patient has regular exercise: not asked.    The patient denies current symptoms of depression.    Review of Systems: Review of Systems  Constitutional: Negative for chills and fever.  HENT: Negative for congestion.   Respiratory: Negative for cough and shortness of breath.   Cardiovascular: Negative for chest pain and palpitations.  Gastrointestinal: Negative for abdominal pain, constipation, diarrhea, heartburn, nausea and vomiting.  Genitourinary: Negative for dysuria, frequency and urgency.  Skin: Negative for itching and rash.  Neurological: Negative for dizziness and headaches.  Endo/Heme/Allergies: Negative for polydipsia.  Psychiatric/Behavioral: Negative for depression.    Past Medical History:  Past Medical History:  Diagnosis Date  . Chicken pox   . Endometriosis   . GERD (gastroesophageal reflux disease)    heartburn and indigestion  . IBS (irritable bowel syndrome)   . Migraines   . Seasonal allergies     Past Surgical History:  Past Surgical History:  Procedure Laterality Date  . APPENDECTOMY  10/09/2013  . bowel obstruction  01/2006  . ESOPHAGOGASTRODUODENOSCOPY N/A 02/25/2015   Procedure:  ESOPHAGOGASTRODUODENOSCOPY (EGD);  Surgeon: Midge Minium, MD;  Location: [facility];  Service: Gastroenterology;  Laterality: N/A;    Gynecologic History:  No LMP recorded. Contraception: norethindrone 5mg  for endometriosis Last Pap: Results were:10/09/2013 no abnormalities   Obstetric History: [id number]  Family History:  Family History  Problem Relation Age of Onset  . Arthritis Mother   . Glaucoma Mother   . Hypothyroidism Mother   . Arthritis Maternal Grandmother   . Colon cancer Maternal Grandmother   . Arthritis Maternal Grandfather   . Arthritis Paternal Grandmother   . Depression Paternal Grandmother   . Arthritis Paternal Grandfather   . Hypertension Paternal Grandfather     Social History:  Social History   Socioeconomic History  . Marital status: Married    Spouse name: Not on file  . Number of children: Not on file  . Years of education: Not on file  . Highest education level: Not on file  Social Needs  . Financial resource strain: Not on file  . Food insecurity - worry: Not on file  . Food insecurity - inability: Not on file  . Transportation needs - medical: Not on file  . Transportation needs - non-medical: Not on file  Occupational History  . Not on file  Tobacco Use  . Smoking status: Never Smoker  . Smokeless tobacco: Never Used  Substance and Sexual Activity  . Alcohol use: No  . Drug use: No  . Sexual activity: Yes    Partners: Male  Other Topics Concern  . Not on file  Social History Narrative  . Not on file  Allergies:  Allergies  Allergen Reactions  . Cephalosporins Hives and Rash    Medications: Prior to Admission medications   Medication Sig Start Date End Date Taking? Authorizing Provider  loratadine (CLARITIN) 10 MG tablet Take by mouth.   Yes [provider]  Multiple Vitamin (MULTI-VITAMINS) TABS Take by mouth.   Yes [provider]  norethindrone (AYGESTIN) 5 MG tablet Take 1 tablet (5 mg total)  by mouth daily. 12/15/16  Yes Vena AustriaStaebler, Reiley Bertagnolli, MD    Physical Exam Vitals: Blood pressure 126/70, pulse 84, height 5\' 3"  (1.6 m), weight 210 lb (95.3 kg).  General: NAD HEENT: normocephalic, anicteric Thyroid: no enlargement, no palpable nodules Pulmonary: No increased work of breathing, CTAB Cardiovascular: RRR, distal pulses 2+ Breast: Breast symmetrical, no tenderness, no palpable nodules or masses, no skin or nipple retraction present, no nipple discharge.  No axillary or supraclavicular lymphadenopathy. Abdomen: NABS, soft, non-tender, non-distended.  Umbilicus without lesions.  No hepatomegaly, splenomegaly or masses palpable. No evidence of hernia  Genitourinary:  External: Normal external female genitalia.  Normal urethral meatus, normal  Bartholin's and Skene's glands.    Vagina: Normal vaginal mucosa, no evidence of prolapse.    Cervix: Grossly normal in appearance, no bleeding  Uterus: Non-enlarged, mobile, normal contour.  No CMT  Adnexa: ovaries non-enlarged, no adnexal masses  Rectal: deferred  Lymphatic: no evidence of inguinal lymphadenopathy Extremities: no edema, erythema, or tenderness Neurologic: Grossly intact Psychiatric: mood appropriate, affect full  Female chaperone present for pelvic and breast  portions of the physical exam    Assessment: 32 y.o. G2P2002 routine annual exam  Plan: Problem List Items Addressed This Visit    None      1) STI screening was not offered  2)  ASCCP guidelines and rational discussed.  Patient opts for every 3 years screening interval  3) Contraception - the patient is currently using  oral progesterone-only contraceptive.  She is happy with her current form of contraception and plans to continue  4) Routine healthcare maintenance including cholesterol, diabetes screening discussed managed by PCP  5) No Follow-up on file.   Vena AustriaAndreas Shamica Moree, MD, Merlinda FrederickFACOG [facility], [facility] 11/22/2017, 3:51  PM

## 2017-11-24 ENCOUNTER — Encounter: Payer: Self-pay | Admitting: Obstetrics and Gynecology

## 2017-11-24 LAB — PAPIG, HPV, RFX 16/18
HPV, HIGH-RISK: NEGATIVE
PAP Smear Comment: 0

## 2017-11-29 ENCOUNTER — Encounter: Payer: Self-pay | Admitting: Obstetrics and Gynecology

## 2018-12-11 ENCOUNTER — Other Ambulatory Visit: Payer: Self-pay | Admitting: Obstetrics and Gynecology

## 2019-02-24 ENCOUNTER — Other Ambulatory Visit: Payer: Self-pay

## 2019-02-24 MED ORDER — NORETHINDRONE ACETATE 5 MG PO TABS: 5.0000 mg | ORAL_TABLET | Freq: Every day | ORAL | 0 refills | Status: DC

## 2019-02-24 NOTE — Telephone Encounter (Signed)
Pt has appt on 6/19 and needs refill of [facility] [address].  [phone]  Left detailed msg refill eRx'd.

## 2019-03-30 NOTE — Progress Notes (Signed)
Gynecology Annual Exam   PCP: Coral Spikes, DO  Chief Complaint:  Chief Complaint  Patient presents with   Gynecologic Exam    History of Present Illness: Patient is a 33 y.o. [id number] presents for annual exam. The patient has no complaints today.   LMP: No LMP recorded. Absent on norethindrone  The patient is sexually active. She currently uses oral progesterone-only contraceptive for contraception. There is no notable family history of breast or ovarian cancer in her family.  The patient wears seatbelts: yes.   The patient has regular exercise: not asked.    The patient denies current symptoms of depression.    Review of Systems: Review of Systems  Constitutional: Negative for chills and fever.  HENT: Negative for congestion.   Respiratory: Negative for cough and shortness of breath.   Cardiovascular: Negative for chest pain and palpitations.  Gastrointestinal: Negative for abdominal pain, constipation, diarrhea, heartburn, nausea and vomiting.  Genitourinary: Negative for dysuria, frequency and urgency.  Skin: Negative for itching and rash.  Neurological: Negative for dizziness and headaches.  Endo/Heme/Allergies: Negative for polydipsia.  Psychiatric/Behavioral: Negative for depression.    Past Medical History:  Past Medical History:  Diagnosis Date   Chicken pox    Endometriosis    GERD (gastroesophageal reflux disease)    heartburn and indigestion   IBS (irritable bowel syndrome)    Migraines    Seasonal allergies     Past Surgical History:  Past Surgical History:  Procedure Laterality Date   APPENDECTOMY  10/09/2013   bowel obstruction  01/2006   ESOPHAGOGASTRODUODENOSCOPY N/A 02/25/2015   Procedure: ESOPHAGOGASTRODUODENOSCOPY (EGD);  Surgeon: Lucilla Lame, MD;  Location: [place];  Service: Gastroenterology;  Laterality: N/A;    Gynecologic History:  No LMP recorded. Contraception: oral progesterone-only contraceptive Last  Pap: Results were:11/22/2017 NIL and HR HPV negative   Obstetric History: [id number]  Family History:  Family History  Problem Relation Age of Onset   Arthritis Mother    Glaucoma Mother    Hypothyroidism Mother    Arthritis Maternal Grandmother    Colon cancer Maternal Grandmother    Arthritis Maternal Grandfather    Arthritis Paternal Grandmother    Depression Paternal Grandmother    Arthritis Paternal Grandfather    Hypertension Paternal Grandfather     Social History:  Social History   Socioeconomic History   Marital status: Married    Spouse name: Not on file   Number of children: Not on file   Years of education: Not on file   Highest education level: Not on file  Occupational History   Not on file  Social Needs   Financial resource strain: Not on file   Food insecurity    Worry: Not on file    Inability: Not on file   Transportation needs    Medical: Not on file    Non-medical: Not on file  Tobacco Use   Smoking status: Never Smoker   Smokeless tobacco: Never Used  Substance and Sexual Activity   Alcohol use: No   Drug use: No   Sexual activity: Yes    Partners: Male  Lifestyle   Physical activity    Days per week: 0 days    Minutes per session: 0 min   Stress: Not at all  Relationships   Social connections    Talks on phone: More than three times a week    Gets together: Once a week    Attends religious service: More  than 4 times per year    Active member of club or organization: No    Attends meetings of clubs or organizations: Never    Relationship status: Married   Intimate partner violence    Fear of current or ex partner: No    Emotionally abused: No    Physically abused: No    Forced sexual activity: No  Other Topics Concern   Not on file  Social History Narrative   Not on file    Allergies:  Allergies  Allergen Reactions   Cephalosporins Hives and Rash    Medications: Prior to Admission medications     Medication Sig Start Date End Date Taking? Authorizing Provider  loratadine (CLARITIN) 10 MG tablet Take by mouth.    [provider]  Multiple Vitamin (MULTI-VITAMINS) TABS Take by mouth.    [provider]  norethindrone (AYGESTIN) 5 MG tablet Take 1 tablet (5 mg total) by mouth daily. 02/24/19   Vena AustriaStaebler, Stephone Gum, MD    Physical Exam Vitals: Blood pressure 132/76, pulse (!) 103, height 5\' 3"  (1.6 m), weight 250 lb (113.4 kg).   General: NAD HEENT: normocephalic, anicteric Thyroid: no enlargement, no palpable nodules Pulmonary: No increased work of breathing, CTAB Cardiovascular: RRR, distal pulses 2+ Breast: Breast symmetrical, no tenderness, no palpable nodules or masses, no skin or nipple retraction present, no nipple discharge.  No axillary or supraclavicular lymphadenopathy. Abdomen: NABS, soft, non-tender, non-distended.  Umbilicus without lesions.  No hepatomegaly, splenomegaly or masses palpable. No evidence of hernia  Genitourinary:  External: Normal external female genitalia.  Normal urethral meatus, normal Bartholin's and Skene's glands.    Vagina: Normal vaginal mucosa, no evidence of prolapse.    Cervix: Grossly normal in appearance, no bleeding  Uterus: Non-enlarged, mobile, normal contour.  No CMT  Adnexa: ovaries non-enlarged, no adnexal masses  Rectal: deferred  Lymphatic: no evidence of inguinal lymphadenopathy Extremities: no edema, erythema, or tenderness Neurologic: Grossly intact Psychiatric: mood appropriate, affect full  Female chaperone present for pelvic and breast  portions of the physical exam    Assessment: 33 y.o. [id number] routine annual exam  Plan: Problem List Items Addressed This Visit    None    Visit Diagnoses    Encounter for gynecological examination without abnormal finding    -  Primary   Relevant Orders   CMP14+LP+TP+TSH+CBC/Plt   Prolactin   Breast screening       Encounter for surveillance of contraceptive  pills       Migraine without aura and without status migrainosus, not intractable       Relevant Orders   CMP14+LP+TP+TSH+CBC/Plt   Prolactin   Nausea       Relevant Orders   CMP14+LP+TP+TSH+CBC/Plt   Prolactin   Weight gain       Relevant Orders   CMP14+LP+TP+TSH+CBC/Plt   Thyroid disorder screening       Relevant Orders   CMP14+LP+TP+TSH+CBC/Plt   Lipid screening       Relevant Orders   CMP14+LP+TP+TSH+CBC/Plt   Screening for diabetes mellitus       Relevant Orders   CMP14+LP+TP+TSH+CBC/Plt      1) STI screening  was not offered and therefore not obtained  2)  ASCCP guidelines and rational discussed.  Patient opts for every 3 years screening interval  3) Contraception - the patient is currently using  oral progesterone-only contraceptive.  She is has noted some new side-effect like nausea, weight gain, increased appetite, and increased headaches which she associated with the  norethindrone.  Had previously tolerated well.  On norethindrone secondary to a presumptive diagnosis of endometriosis.  She has done a 3 month trail off of norethindrone and noted resumption of prior symptoms which very suggestive of endometriosis.  We discussed symptoms may be secondary to development of some other underlying issue such as thyroid so will check some basic labs today.  Depending on labs we discussed next steps to consider   1) Come off everything  2) Continue norethindrone  3) Switch to Slynd (lower dose progestin only contraceptive)  4) Switch to [place] +/- #2 or #3 or by itself  5) Mirena IUD  6) Diagnostic laparoscopy to evaluate presence/stage endometriosis  4) Routine healthcare maintenance including cholesterol, diabetes screening discussed Ordered today  5) Return in about 1 week (around 04/07/2019) for Phone visit sometime next week (patient preferrence on time).   Vena AustriaAndreas Charnita Trudel, MD, Evern CoreFACOG [facility], [facility] 03/31/2019, 9:02 AM

## 2019-03-31 ENCOUNTER — Ambulatory Visit (INDEPENDENT_AMBULATORY_CARE_PROVIDER_SITE_OTHER): Payer: BC Managed Care – PPO | Admitting: Obstetrics and Gynecology

## 2019-03-31 ENCOUNTER — Other Ambulatory Visit: Payer: Self-pay

## 2019-03-31 ENCOUNTER — Encounter: Payer: Self-pay | Admitting: Obstetrics and Gynecology

## 2019-03-31 VITALS — BP 132/76 | HR 103 | Ht 63.0 in | Wt 250.0 lb

## 2019-03-31 DIAGNOSIS — Z1329 Encounter for screening for other suspected endocrine disorder: Secondary | ICD-10-CM

## 2019-03-31 DIAGNOSIS — Z13220 Encounter for screening for lipoid disorders: Secondary | ICD-10-CM

## 2019-03-31 DIAGNOSIS — Z131 Encounter for screening for diabetes mellitus: Secondary | ICD-10-CM

## 2019-03-31 DIAGNOSIS — R110 Nausea: Secondary | ICD-10-CM

## 2019-03-31 DIAGNOSIS — Z3041 Encounter for surveillance of contraceptive pills: Secondary | ICD-10-CM

## 2019-03-31 DIAGNOSIS — G43009 Migraine without aura, not intractable, without status migrainosus: Secondary | ICD-10-CM

## 2019-03-31 DIAGNOSIS — Z1239 Encounter for other screening for malignant neoplasm of breast: Secondary | ICD-10-CM

## 2019-03-31 DIAGNOSIS — Z01419 Encounter for gynecological examination (general) (routine) without abnormal findings: Secondary | ICD-10-CM

## 2019-03-31 DIAGNOSIS — R635 Abnormal weight gain: Secondary | ICD-10-CM

## 2019-03-31 NOTE — Patient Instructions (Signed)
1) Come off everything 2) Continue norethindrone 3) Switch to Slynd (lower dose progestin only contraceptive) 4) Switch to [place] +/- #2 or #3 or by itself 5) Diagnostic laparoscopy to evaluate presence/stage endometriosis

## 2019-04-01 LAB — CMP14+LP+TP+TSH+CBC/PLT
ALT: 25 IU/L (ref 0–32)
AST: 18 IU/L (ref 0–40)
Albumin/Globulin Ratio: 1.6 (ref 1.2–2.2)
Albumin: 4.6 g/dL (ref 3.8–4.8)
Alkaline Phosphatase: 59 IU/L (ref 39–117)
BUN/Creatinine Ratio: 19 (ref 9–23)
BUN: 12 mg/dL (ref 6–20)
Bilirubin Total: 0.3 mg/dL (ref 0.0–1.2)
CO2: 21 mmol/L (ref 20–29)
Calcium: 9.5 mg/dL (ref 8.7–10.2)
Chloride: 105 mmol/L (ref 96–106)
Cholesterol, Total: 161 mg/dL (ref 100–199)
Creatinine, Ser: 0.64 mg/dL (ref 0.57–1.00)
Free Thyroxine Index: 2.3 (ref 1.2–4.9)
GFR calc Af Amer: 135 mL/min/{1.73_m2} (ref >59)
GFR calc non Af Amer: 117 mL/min/{1.73_m2} (ref >59)
Globulin, Total: 2.9 g/dL (ref 1.5–4.5)
Glucose: 105 mg/dL — ABNORMAL HIGH (ref 65–99)
HDL: 37 mg/dL — ABNORMAL LOW (ref >39)
Hematocrit: 36.4 % (ref 34.0–46.6)
Hemoglobin: 12.1 g/dL (ref 11.1–15.9)
LDL Calculated: 104 mg/dL — ABNORMAL HIGH (ref 0–99)
LDl/HDL Ratio: 2.8 ratio (ref 0.0–3.2)
MCH: 27.7 pg (ref 26.6–33.0)
MCHC: 33.2 g/dL (ref 31.5–35.7)
MCV: 83 fL (ref 79–97)
Platelets: 331 10*3/uL (ref 150–450)
Potassium: 3.9 mmol/L (ref 3.5–5.2)
RBC: 4.37 x10E6/uL (ref 3.77–5.28)
RDW: 13 % (ref 11.7–15.4)
Sodium: 140 mmol/L (ref 134–144)
T3 Uptake Ratio: 28 % (ref 24–39)
T4, Total: 8.2 ug/dL (ref 4.5–12.0)
TSH: 4.22 u[IU]/mL (ref 0.450–4.500)
Total Protein: 7.5 g/dL (ref 6.0–8.5)
Triglycerides: 100 mg/dL (ref 0–149)
VLDL Cholesterol Cal: 20 mg/dL (ref 5–40)
WBC: 6.2 10*3/uL (ref 3.4–10.8)

## 2019-04-01 LAB — PROLACTIN: Prolactin: 51.5 ng/mL — ABNORMAL HIGH (ref 4.8–23.3)

## 2019-04-03 ENCOUNTER — Other Ambulatory Visit: Payer: Self-pay | Admitting: Obstetrics and Gynecology

## 2019-04-03 DIAGNOSIS — R519 Headache, unspecified: Secondary | ICD-10-CM

## 2019-04-03 DIAGNOSIS — E221 Hyperprolactinemia: Secondary | ICD-10-CM

## 2019-04-12 ENCOUNTER — Other Ambulatory Visit: Payer: Self-pay

## 2019-04-12 ENCOUNTER — Ambulatory Visit (INDEPENDENT_AMBULATORY_CARE_PROVIDER_SITE_OTHER): Payer: BC Managed Care – PPO | Admitting: Obstetrics and Gynecology

## 2019-04-12 DIAGNOSIS — E229 Hyperfunction of pituitary gland, unspecified: Secondary | ICD-10-CM

## 2019-04-12 DIAGNOSIS — R7989 Other specified abnormal findings of blood chemistry: Secondary | ICD-10-CM

## 2019-04-12 MED ORDER — NORETHINDRONE ACETATE 5 MG PO TABS: 5.0000 mg | ORAL_TABLET | Freq: Every day | ORAL | 3 refills | Status: DC

## 2019-04-12 NOTE — Progress Notes (Signed)
Discussion of lab results with patient and her husband Time 23:06

## 2019-04-19 ENCOUNTER — Other Ambulatory Visit: Payer: Self-pay | Admitting: Obstetrics and Gynecology

## 2019-04-19 MED ORDER — DIAZEPAM 5 MG PO TABS: 5.0000 mg | ORAL_TABLET | Freq: Once | ORAL | 0 refills | Status: AC

## 2019-04-19 MED ORDER — DIAZEPAM 5 MG PO TABS: 5.0000 mg | ORAL_TABLET | Freq: Once | ORAL | 0 refills | Status: DC

## 2019-04-20 ENCOUNTER — Other Ambulatory Visit: Payer: Self-pay | Admitting: Obstetrics and Gynecology

## 2019-04-20 ENCOUNTER — Other Ambulatory Visit: Payer: Self-pay

## 2019-04-20 ENCOUNTER — Ambulatory Visit
Admission: RE | Admit: 2019-04-20 | Discharge: 2019-04-20 | Disposition: A | Discharge status: Home / Self Care | Payer: BC Managed Care – PPO | Source: Ambulatory Visit | Attending: Obstetrics and Gynecology | Admitting: Obstetrics and Gynecology

## 2019-04-20 DIAGNOSIS — R51 Headache: Secondary | ICD-10-CM | POA: Insufficient documentation

## 2019-04-20 DIAGNOSIS — E221 Hyperprolactinemia: Secondary | ICD-10-CM | POA: Diagnosis present

## 2019-04-20 DIAGNOSIS — R519 Headache, unspecified: Secondary | ICD-10-CM

## 2019-04-20 MED ORDER — GADOBUTROL 1 MMOL/ML IV SOLN
7.0000 mL | Freq: Once | INTRAVENOUS | Status: AC | PRN
  Administered 2019-04-20: 10:00:00 7 mL via INTRAVENOUS

## 2019-04-20 NOTE — Progress Notes (Signed)
Needs lab draw sometime in the next couple of days.  It has to be a morning fasting value

## 2019-04-21 ENCOUNTER — Telehealth: Payer: Self-pay | Admitting: Obstetrics and Gynecology

## 2019-04-21 NOTE — Telephone Encounter (Signed)
Patient is schedule Monday, 04/24/19

## 2019-04-21 NOTE — Telephone Encounter (Signed)
-----   Message from Malachy Mood, MD sent at 04/20/2019  8:13 PM EDT ----- Needs lab draw sometime in the next couple of days.  It has to be a morning fasting value

## 2019-04-24 ENCOUNTER — Other Ambulatory Visit: Payer: Self-pay

## 2019-04-24 ENCOUNTER — Other Ambulatory Visit: Payer: BC Managed Care – PPO

## 2019-04-24 DIAGNOSIS — E221 Hyperprolactinemia: Secondary | ICD-10-CM

## 2019-04-25 LAB — PROLACTIN: Prolactin: 38.1 ng/mL — ABNORMAL HIGH (ref 4.8–23.3)

## 2019-04-26 ENCOUNTER — Other Ambulatory Visit: Payer: Self-pay | Admitting: Obstetrics and Gynecology

## 2019-04-26 DIAGNOSIS — R7989 Other specified abnormal findings of blood chemistry: Secondary | ICD-10-CM

## 2019-05-03 ENCOUNTER — Ambulatory Visit: Payer: BC Managed Care – PPO | Admitting: Obstetrics and Gynecology

## 2019-05-05 ENCOUNTER — Other Ambulatory Visit: Payer: Self-pay

## 2019-05-05 NOTE — Telephone Encounter (Signed)
Pt calling; is out of town on vacation; left rx at home.  Is it possible to get rx for pharm near her to her thru vacation?  [phone]

## 2020-03-31 ENCOUNTER — Other Ambulatory Visit: Payer: Self-pay | Admitting: Obstetrics and Gynecology

## 2020-05-06 ENCOUNTER — Telehealth: Payer: Self-pay | Admitting: Obstetrics and Gynecology

## 2020-05-06 MED ORDER — NORETHINDRONE ACETATE 5 MG PO TABS: 5.0000 mg | ORAL_TABLET | Freq: Every day | ORAL | 0 refills | Status: DC

## 2020-05-06 NOTE — Telephone Encounter (Signed)
Patient is scheduled for 06/04/20 at 3:30 for annual with AMS. Patient is requesting refill on medication to get [facility] appointment[facility] [address]

## 2020-05-06 NOTE — Telephone Encounter (Signed)
RF sent.

## 2020-05-27 ENCOUNTER — Other Ambulatory Visit: Payer: Self-pay | Admitting: Obstetrics and Gynecology

## 2020-06-04 ENCOUNTER — Ambulatory Visit (INDEPENDENT_AMBULATORY_CARE_PROVIDER_SITE_OTHER): Payer: BC Managed Care – PPO | Admitting: Obstetrics and Gynecology

## 2020-06-04 ENCOUNTER — Other Ambulatory Visit: Payer: Self-pay

## 2020-06-04 ENCOUNTER — Encounter: Payer: Self-pay | Admitting: Obstetrics and Gynecology

## 2020-06-04 VITALS — BP 136/78 | HR 91 | Ht 63.0 in | Wt 218.0 lb

## 2020-06-04 DIAGNOSIS — Z23 Encounter for immunization: Secondary | ICD-10-CM

## 2020-06-04 DIAGNOSIS — Z1239 Encounter for other screening for malignant neoplasm of breast: Secondary | ICD-10-CM

## 2020-06-04 DIAGNOSIS — Z01419 Encounter for gynecological examination (general) (routine) without abnormal findings: Secondary | ICD-10-CM

## 2020-06-04 MED ORDER — NORETHINDRONE ACETATE 5 MG PO TABS: 5.0000 mg | ORAL_TABLET | Freq: Every day | ORAL | 3 refills | Status: DC

## 2020-06-04 NOTE — Progress Notes (Signed)
Gynecology Annual Exam   PCP: Patient, No Pcp Per  Chief Complaint:  Chief Complaint  Patient presents with  . Gynecologic Exam    History of Present Illness: Patient is a 34 y.o. Megan Norton presents for annual exam. The patient has no complaints today.   LMP: No LMP recorded. (Menstrual status: Oral contraceptives). Amenorrhea on norethindrone.  Still occasional pelvic pain but much improved.  The patient is sexually active. She currently uses oral progesterone-only contraceptive for contraception. She denies dyspareunia.  The patient does perform self breast exams.  There is no notable family history of breast or ovarian cancer in her family.  The patient wears seatbelts: yes.   The patient has regular exercise: not asked.    The patient denies current symptoms of depression.    Review of Systems: Review of Systems  Constitutional: Negative for chills and fever.  HENT: Negative for congestion.   Respiratory: Negative for cough and shortness of breath.   Cardiovascular: Negative for chest pain and palpitations.  Gastrointestinal: Negative for abdominal pain, constipation, diarrhea, heartburn, nausea and vomiting.  Genitourinary: Negative for dysuria, frequency and urgency.  Skin: Negative for itching and rash.  Neurological: Negative for dizziness and headaches.  Endo/Heme/Allergies: Negative for polydipsia.  Psychiatric/Behavioral: Negative for depression.    Past Medical History:  Patient Active Problem List   Diagnosis Date Noted  . Chronic fatigue 06/21/2016    Past Surgical History:  Past Surgical History:  Procedure Laterality Date  . APPENDECTOMY  10/09/2013  . bowel obstruction  01/2006  . ESOPHAGOGASTRODUODENOSCOPY N/A 02/25/2015   Procedure: ESOPHAGOGASTRODUODENOSCOPY (EGD);  Surgeon: Midge Minium, MD;  Location: [facility];  Service: Gastroenterology;  Laterality: N/A;    Gynecologic History:  No LMP recorded. (Menstrual status: Oral  contraceptives). Contraception: oral progesterone-only contraceptive Last Pap: Results were:11/22/2017 NIL and HR HPV negative   Obstetric History: [id number]  Family History:  Family History  Problem Relation Age of Onset  . Arthritis Mother   . Glaucoma Mother   . Hypothyroidism Mother   . Arthritis Maternal Grandmother   . Colon cancer Maternal Grandmother   . Arthritis Maternal Grandfather   . Arthritis Paternal Grandmother   . Depression Paternal Grandmother   . Arthritis Paternal Grandfather   . Hypertension Paternal Grandfather     Social History:  Social History   Socioeconomic History  . Marital status: Married    Spouse name: Not on file  . Number of children: Not on file  . Years of education: Not on file  . Highest education level: Not on file  Occupational History  . Not on file  Tobacco Use  . Smoking status: Never Smoker  . Smokeless tobacco: Never Used  Vaping Use  . Vaping Use: Never used  Substance and Sexual Activity  . Alcohol use: No  . Drug use: No  . Sexual activity: Yes    Partners: Male  Other Topics Concern  . Not on file  Social History Narrative  . Not on file   Social Determinants of Health   Financial Resource Strain:   . Difficulty of Paying Living Expenses: Not on file  Food Insecurity:   . Worried About Programme researcher, broadcasting/film/video in the Last Year: Not on file  . Ran Out of Food in the Last Year: Not on file  Transportation Needs:   . Lack of Transportation (Medical): Not on file  . Lack of Transportation (Non-Medical): Not on file  Physical Activity:   .  Days of Exercise per Week: Not on file  . Minutes of Exercise per Session: Not on file  Stress:   . Feeling of Stress : Not on file  Social Connections:   . Frequency of Communication with Friends and Family: Not on file  . Frequency of Social Gatherings with Friends and Family: Not on file  . Attends Religious Services: Not on file  . Active Member of Clubs or Organizations: Not  on file  . Attends Banker Meetings: Not on file  . Marital Status: Not on file  Intimate Partner Violence:   . Fear of Current or Ex-Partner: Not on file  . Emotionally Abused: Not on file  . Physically Abused: Not on file  . Sexually Abused: Not on file    Allergies:  Allergies  Allergen Reactions  . Cephalosporins Hives and Rash    Medications: Prior to Admission medications   Medication Sig Start Date End Date Taking? Authorizing Provider  loratadine (CLARITIN) 10 MG tablet Take by mouth.   Yes [provider]  Multiple Vitamin (MULTI-VITAMINS) TABS Take by mouth.   Yes [provider]  norethindrone (AYGESTIN) 5 MG tablet TAKE 1 TABLET BY MOUTH EVERY DAY 05/27/20  Yes Vena Austria, MD    Physical Exam Vitals: Blood pressure 136/78, pulse 91, height 5\' 3"  (1.6 m), weight 218 lb (98.9 kg).  General: NAD HEENT: normocephalic, anicteric Thyroid: no enlargement, no palpable nodules Pulmonary: No increased work of breathing, CTAB Cardiovascular: RRR, distal pulses 2+ Breast: Breast symmetrical, no tenderness, no palpable nodules or masses, no skin or nipple retraction present, no nipple discharge.  No axillary or supraclavicular lymphadenopathy. Abdomen: NABS, soft, non-tender, non-distended.  Umbilicus without lesions.  No hepatomegaly, splenomegaly or masses palpable. No evidence of hernia  Genitourinary:  External: Normal external female genitalia.  Normal urethral meatus, normal Bartholin's and Skene's glands.    Vagina: Normal vaginal mucosa, no evidence of prolapse.    Cervix: Grossly normal in appearance, no bleeding  Uterus: Non-enlarged, mobile, normal contour.  No CMT  Adnexa: ovaries non-enlarged, no adnexal masses  Rectal: deferred  Lymphatic: no evidence of inguinal lymphadenopathy Extremities: no edema, erythema, or tenderness Neurologic: Grossly intact Psychiatric: mood appropriate, affect full  Female chaperone present  for pelvic and breast  portions of the physical exam  Immunization History  Administered Date(s) Administered  . Moderna SARS-COVID-2 Vaccination 05/10/2020  . Tdap 06/04/2020     Assessment: 34 y.o. G2P2002 routine annual exam  Plan: Problem List Items Addressed This Visit    None    Visit Diagnoses    Need for Tdap vaccination    -  Primary   Relevant Orders   Tdap vaccine greater than or equal to 7yo IM (Completed)   Encounter for gynecological examination without abnormal finding       Breast screening          1) STI screening  was notoffered and therefore not obtained  2)  ASCCP guidelines and rational discussed.  Patient opts for every 3 years screening interval  3) Contraception - the patient is currently using  oral progesterone-only contraceptive.  She is happy with her current form of contraception and plans to continue  4) Routine healthcare maintenance including cholesterol, diabetes screening discussed managed by PCP  5) Return in about 1 year (around 06/04/2021) for annual.   06/06/2021, MD, Vena Austria OB/[facility], [facility] 06/04/2020, 4:03 PM

## 2021-07-01 ENCOUNTER — Other Ambulatory Visit: Payer: Self-pay | Admitting: Obstetrics and Gynecology

## 2021-07-07 NOTE — Telephone Encounter (Signed)
Hey Jarae, this is Huntley Dec with [facility] trying to reach you to scheduled an office visit follow up with Dr. Vena Austria. Please contact our office so we can get you scheduled. Thank you! [phone]

## 2021-10-06 ENCOUNTER — Other Ambulatory Visit: Payer: Self-pay | Admitting: Obstetrics and Gynecology

## 2021-11-17 ENCOUNTER — Telehealth: Payer: Self-pay

## 2021-11-17 MED ORDER — NORETHINDRONE ACETATE 5 MG PO TABS: 5.0000 mg | ORAL_TABLET | Freq: Every day | ORAL | 0 refills | Status: DC

## 2021-11-17 NOTE — Telephone Encounter (Signed)
Pt calling; called last week for refill and never heard anything back. [phone] Apologized to pt; adv has scheduled appt so will send in refill; pt prefers [facility] at [facility]; adv will send in.

## 2021-12-08 ENCOUNTER — Other Ambulatory Visit (HOSPITAL_COMMUNITY)
Admission: RE | Admit: 2021-12-08 | Discharge: 2021-12-08 | Disposition: A | Discharge status: Home / Self Care | Payer: 59 | Source: Ambulatory Visit | Attending: Obstetrics and Gynecology | Admitting: Obstetrics and Gynecology

## 2021-12-08 ENCOUNTER — Ambulatory Visit (INDEPENDENT_AMBULATORY_CARE_PROVIDER_SITE_OTHER): Payer: 59 | Admitting: Obstetrics and Gynecology

## 2021-12-08 ENCOUNTER — Encounter: Payer: Self-pay | Admitting: Obstetrics and Gynecology

## 2021-12-08 ENCOUNTER — Other Ambulatory Visit: Payer: Self-pay

## 2021-12-08 VITALS — BP 138/90 | Ht 63.0 in | Wt 251.0 lb

## 2021-12-08 DIAGNOSIS — Z01419 Encounter for gynecological examination (general) (routine) without abnormal findings: Secondary | ICD-10-CM

## 2021-12-08 DIAGNOSIS — Z1151 Encounter for screening for human papillomavirus (HPV): Secondary | ICD-10-CM | POA: Diagnosis present

## 2021-12-08 DIAGNOSIS — Z1329 Encounter for screening for other suspected endocrine disorder: Secondary | ICD-10-CM

## 2021-12-08 DIAGNOSIS — L749 Eccrine sweat disorder, unspecified: Secondary | ICD-10-CM

## 2021-12-08 DIAGNOSIS — Z124 Encounter for screening for malignant neoplasm of cervix: Secondary | ICD-10-CM | POA: Diagnosis present

## 2021-12-08 DIAGNOSIS — Z131 Encounter for screening for diabetes mellitus: Secondary | ICD-10-CM

## 2021-12-08 DIAGNOSIS — R000 Tachycardia, unspecified: Secondary | ICD-10-CM

## 2021-12-08 DIAGNOSIS — Z0000 Encounter for general adult medical examination without abnormal findings: Secondary | ICD-10-CM

## 2021-12-08 DIAGNOSIS — R42 Dizziness and giddiness: Secondary | ICD-10-CM

## 2021-12-08 DIAGNOSIS — Z3041 Encounter for surveillance of contraceptive pills: Secondary | ICD-10-CM

## 2021-12-08 MED ORDER — NORETHINDRONE ACETATE 5 MG PO TABS: 5.0000 mg | ORAL_TABLET | Freq: Every day | ORAL | 3 refills | Status: DC

## 2021-12-08 NOTE — Progress Notes (Signed)
PCP:  Patient, No Pcp Per (Inactive)   Chief Complaint  Patient presents with   Gynecologic Exam     HPI:      Ms. Megan Norton is a 36 y.o. [id number] whose LMP was No LMP recorded. (Menstrual status: Oral contraceptives)., presents today for her annual examination.  Her menses are absent with aygestin 5 mg, rarely with dysmen. Hx of endometriosis and migraines.   Sex activity: single partner, contraception - oral progesterone-only contraceptive. Has vag dryness/pain with sex, improved with lubricants.  Last Pap: 11/22/17 Results were: no abnormalities /neg HPV DNA   There is no FH of breast cancer. There is no FH of ovarian cancer. The patient does do self-breast exams.  Tobacco use: The patient denies current or previous tobacco use. Alcohol use: none No drug use.  Exercise: not active  She does get adequate calcium but not Vitamin D in her diet.  Pt having issues with dizziness, cold sweats (day and night), wt changes and occas tachycardia; sx off and on for a few yrs. Also gets migraines.  Hasn't put all the sx together at one time. Did feel like she was going to pass out 12/22 at [facility]. Notes heart beats hard or fast at times, even at rest. Drinks some caffeine daily. AppleWatch states HR=115/120 when pt feels it's fast. HR can be 140 just walking up stairs. Doesn't exercise, FH DM and hypoglycemia. Had normal HgA1C and thyroid 3/22. Pt states her thyroid level goes up and down (looking in Epic, always higher end of normal), not being treated.   Past Medical History:  Diagnosis Date   Chicken pox    Endometriosis    GERD (gastroesophageal reflux disease)    heartburn and indigestion   IBS (irritable bowel syndrome)    Migraines    Seasonal allergies      Past Surgical History:  Procedure Laterality Date   APPENDECTOMY  10/09/2013   bowel obstruction  01/2006   ESOPHAGOGASTRODUODENOSCOPY N/A 02/25/2015   Procedure: ESOPHAGOGASTRODUODENOSCOPY (EGD);  Surgeon: Midge Minium, MD;  Location: [facility];  Service: Gastroenterology;  Laterality: N/A;    Family History  Problem Relation Age of Onset   Arthritis Mother    Glaucoma Mother    Hypothyroidism Mother    Arthritis Maternal Grandmother    Colon cancer Maternal Grandmother    Arthritis Maternal Grandfather    Arthritis Paternal Grandmother    Depression Paternal Grandmother    Arthritis Paternal Grandfather    Hypertension Paternal Grandfather     Social History   Socioeconomic History   Marital status: Married    Spouse name: Not on file   Number of children: Not on file   Years of education: Not on file   Highest education level: Not on file  Occupational History   Not on file  Tobacco Use   Smoking status: Never   Smokeless tobacco: Never  Vaping Use   Vaping Use: Never used  Substance and Sexual Activity   Alcohol use: No   Drug use: No   Sexual activity: Yes    Partners: Male    Birth control/protection: Pill  Other Topics Concern   Not on file  Social History Narrative   Not on file   Social Determinants of Health   Financial Resource Strain: Not on file  Food Insecurity: Not on file  Transportation Needs: Not on file  Physical Activity: Not on file  Stress: Not on file  Social Connections: Not on file  Intimate Partner Violence: Not on file     Current Outpatient Medications:    loratadine (CLARITIN) 10 MG tablet, Take by mouth., Disp: , Rfl:    Multiple Vitamin (MULTI-VITAMINS) TABS, Take by mouth., Disp: , Rfl:    norethindrone (AYGESTIN) 5 MG tablet, Take 1 tablet (5 mg total) by mouth daily., Disp: 90 tablet, Rfl: 3     ROS:  Review of Systems  Constitutional:  Positive for fatigue. Negative for fever and unexpected weight change.  Respiratory:  Negative for cough, shortness of breath and wheezing.   Cardiovascular:  Negative for chest pain, palpitations and leg swelling.  Gastrointestinal:  Negative for blood in stool, constipation,  diarrhea, nausea and vomiting.  Endocrine: Negative for cold intolerance, heat intolerance and polyuria.  Genitourinary:  Negative for dyspareunia, dysuria, flank pain, frequency, genital sores, hematuria, menstrual problem, pelvic pain, urgency, vaginal bleeding, vaginal discharge and vaginal pain.  Musculoskeletal:  Positive for arthralgias. Negative for back pain, joint swelling and myalgias.  Skin:  Negative for rash.  Neurological:  Positive for dizziness, light-headedness and headaches. Negative for syncope and numbness.  Hematological:  Negative for adenopathy.  Psychiatric/Behavioral:  Negative for agitation, confusion, sleep disturbance and suicidal ideas. The patient is not nervous/anxious.   BREAST: No symptoms   Objective: BP 138/90    Ht 5\' 3"  (1.6 m)    Wt 251 lb (113.9 kg)    BMI 44.46 kg/m  HR=120 at exam  Physical Exam Constitutional:      Appearance: She is well-developed.  Genitourinary:     Vulva normal.     Right Labia: No rash, tenderness or lesions.    Left Labia: No tenderness, lesions or rash.    No vaginal discharge, erythema or tenderness.      Right Adnexa: not tender and no mass present.    Left Adnexa: not tender and no mass present.    No cervical friability or polyp.     Uterus is not enlarged or tender.  Breasts:    Right: No mass, nipple discharge, skin change or tenderness.     Left: No mass, nipple discharge, skin change or tenderness.  Neck:     Thyroid: No thyromegaly.  Cardiovascular:     Rate and Rhythm: Regular rhythm. Tachycardia present.     Heart sounds: Normal heart sounds. No murmur heard. Pulmonary:     Effort: Pulmonary effort is normal.     Breath sounds: Normal breath sounds.  Abdominal:     Palpations: Abdomen is soft.     Tenderness: There is no abdominal tenderness. There is no guarding or rebound.  Musculoskeletal:        General: Normal range of motion.     Cervical back: Normal range of motion.  Lymphadenopathy:      Cervical: No cervical adenopathy.  Neurological:     General: No focal deficit present.     Mental Status: She is alert and oriented to person, place, and time.     Cranial Nerves: No cranial nerve deficit.  Skin:    General: Skin is warm and dry.  Psychiatric:        Mood and Affect: Mood normal.        Behavior: Behavior normal.        Thought Content: Thought content normal.        Judgment: Judgment normal.  Vitals reviewed.    Assessment/Plan: Encounter for annual routine gynecological examination  Cervical cancer screening - Plan: Cytology - PAP  Screening for HPV (human papillomavirus) - Plan: Cytology - PAP  Encounter for surveillance of contraceptive pills - Plan: norethindrone (AYGESTIN) 5 MG tablet; Rx RF  Dizziness - Plan: Hemoglobin A1c, TSH + free T4, Ambulatory referral to Cardiology; check labs. Given tachycardia, could be cardio vs endocrine/hypoglycemia vs DM. Refer to cardio.   Tachycardia - Plan: TSH + free T4, Ambulatory referral to Cardiology; check labs, refer to cardio. D/C caffeine  Sweating abnormality - Plan: Hemoglobin A1c, TSH + free T4  Blood tests for routine general physical examination - Plan: Comprehensive metabolic panel, Hemoglobin A1c, TSH + free T4  Thyroid disorder screening - Plan: TSH + free T4  Screening for diabetes mellitus - Plan: Hemoglobin A1c  Meds ordered this encounter  Medications   norethindrone (AYGESTIN) 5 MG tablet    Sig: Take 1 tablet (5 mg total) by mouth daily.    Dispense:  90 tablet    Refill:  3    Order Specific Question:   Supervising Provider    Answer:   Gae Dry [id number]             GYN counsel adequate intake of calcium and vitamin D, diet and exercise     F/U  Return in about 1 year (around 12/08/2022).  Ryla Cauthon B. Isadora Delorey, PA-C 12/08/2021 3:02 PM

## 2021-12-08 NOTE — Patient Instructions (Signed)
I value your feedback and you entrusting us with your care. If you get a [place] patient survey, I would appreciate you taking the time to let us know about your experience today. Thank you! ? ? ?

## 2021-12-09 LAB — COMPREHENSIVE METABOLIC PANEL
ALT: 18 IU/L (ref 0–32)
AST: 13 IU/L (ref 0–40)
Albumin/Globulin Ratio: 1.6 (ref 1.2–2.2)
Albumin: 4.5 g/dL (ref 3.8–4.8)
Alkaline Phosphatase: 61 IU/L (ref 44–121)
BUN/Creatinine Ratio: 14 (ref 9–23)
BUN: 9 mg/dL (ref 6–20)
Bilirubin Total: 0.2 mg/dL (ref 0.0–1.2)
CO2: 24 mmol/L (ref 20–29)
Calcium: 9.7 mg/dL (ref 8.7–10.2)
Chloride: 103 mmol/L (ref 96–106)
Creatinine, Ser: 0.63 mg/dL (ref 0.57–1.00)
Globulin, Total: 2.8 g/dL (ref 1.5–4.5)
Glucose: 110 mg/dL — ABNORMAL HIGH (ref 70–99)
Potassium: 4.2 mmol/L (ref 3.5–5.2)
Sodium: 141 mmol/L (ref 134–144)
Total Protein: 7.3 g/dL (ref 6.0–8.5)
eGFR: 117 mL/min/{1.73_m2} (ref >59)

## 2021-12-09 LAB — TSH+FREE T4
Free T4: 1.05 ng/dL (ref 0.82–1.77)
TSH: 3.53 u[IU]/mL (ref 0.450–4.500)

## 2021-12-09 LAB — HEMOGLOBIN A1C
Est. average glucose Bld gHb Est-mCnc: 117 mg/dL
Hgb A1c MFr Bld: 5.7 % — ABNORMAL HIGH (ref 4.8–5.6)

## 2021-12-10 LAB — CYTOLOGY - PAP
Comment: NEGATIVE
Diagnosis: NEGATIVE
High risk HPV: NEGATIVE

## 2022-12-31 ENCOUNTER — Other Ambulatory Visit: Payer: Self-pay | Admitting: Obstetrics and Gynecology

## 2022-12-31 ENCOUNTER — Other Ambulatory Visit: Payer: Self-pay

## 2022-12-31 DIAGNOSIS — Z3041 Encounter for surveillance of contraceptive pills: Secondary | ICD-10-CM

## 2022-12-31 MED ORDER — NORETHINDRONE ACETATE 5 MG PO TABS: 5.0000 mg | ORAL_TABLET | Freq: Every day | ORAL | 0 refills | Status: DC

## 2022-12-31 NOTE — Telephone Encounter (Signed)
Pt calling for a refill of norethindrone; appt sched for 02/15/23.  [phone] Pt aware refill eRx'd.

## 2023-02-14 NOTE — Progress Notes (Signed)
PCP:  Patient, No Pcp Per   Chief Complaint  Patient presents with   Annual Exam    Right side pelvic pain      HPI:      Ms. Megan Norton is a 37 y.o. G2P2002 whose LMP was No LMP recorded. (Menstrual status: Oral contraceptives)., presents today for her annual examination.  Her menses are absent with aygestin 5 mg, no BTB/dysmen. Hx of endometriosis and migraines. Endometriosis pain was crampy suprapubic area. Having fleeting RLQ pains intermittently for the past 6 months. No aggrav factors. Sx are sharp, can happen a few in a row and then no more sx for a couple days/wks. No relation to cycle, IBS. Does have some hip pain, not exercising.   Sex activity: single partner, contraception - oral progesterone-only contraceptive. Has vag dryness/pain with sex, improved with lubricants. No change since before endometriosis dx.  Last Pap: 12/08/21 Results were: no abnormalities /neg HPV DNA   There is no FH of breast cancer. There is no FH of ovarian cancer. The patient does do self-breast exams.  Tobacco use: The patient denies current or previous tobacco use. Alcohol use: none No drug use.  Exercise: not active  She does get adequate calcium and Vitamin D in her diet.  Labs with PCP Having issues with LE edema bilat. Upper legs also hurt to touch. Is on her feet all day at work. Has tried compression hose without relief. Eats a lot of sodium/uses salt, drinks coffee/tea, not so much water. Sx worse with heat/humidity. No sx change on wknds vs when at work. Has bilat hip pain, not exercising. Worried about lymphedema. Normal CMP with PCP 1/24, does have mild pre-DM.  Past Medical History:  Diagnosis Date   Acid reflux    Chicken pox    Endometriosis    GERD (gastroesophageal reflux disease)    heartburn and indigestion   IBS (irritable bowel syndrome)    Migraines    Seasonal allergies    Tachycardia      Past Surgical History:  Procedure Laterality Date   APPENDECTOMY   10/09/2013   bowel obstruction  01/2006   ESOPHAGOGASTRODUODENOSCOPY N/A 02/25/2015   Procedure: ESOPHAGOGASTRODUODENOSCOPY (EGD);  Surgeon: Midge Minium, MD;  Location: [facility];  Service: Gastroenterology;  Laterality: N/A;    Family History  Problem Relation Age of Onset   Arthritis Mother    Glaucoma Mother    Hypothyroidism Mother    Arthritis Maternal Grandmother    Colon cancer Maternal Grandmother    Arthritis Maternal Grandfather    Arthritis Paternal Grandmother    Depression Paternal Grandmother    Arthritis Paternal Grandfather    Hypertension Paternal Grandfather     Social History   Socioeconomic History   Marital status: Married    Spouse name: Not on file   Number of children: Not on file   Years of education: Not on file   Highest education level: Not on file  Occupational History   Not on file  Tobacco Use   Smoking status: Never   Smokeless tobacco: Never  Vaping Use   Vaping Use: Never used  Substance and Sexual Activity   Alcohol use: No   Drug use: No   Sexual activity: Yes    Partners: Male    Birth control/protection: Pill  Other Topics Concern   Not on file  Social History Narrative   Not on file   Social Determinants of Health   Financial Resource Strain: Not on  file  Food Insecurity: Not on file  Transportation Needs: Not on file  Physical Activity: Inactive (03/31/2019)   Exercise Vital Sign    Days of Exercise per Week: 0 days    Minutes of Exercise per Session: 0 min  Stress: No Stress Concern Present (03/31/2019)   [employer] of Occupational Health - Occupational Stress Questionnaire    Feeling of Stress : Not at all  Social Connections: Moderately Integrated (03/31/2019)   Social Connection and Isolation Panel [NHANES]    Frequency of Communication with Friends and Family: More than three times a week    Frequency of Social Gatherings with Friends and Family: Once a week    Attends Religious Services: More than  4 times per year    Active Member of [employer] or Organizations: No    Attends Banker Meetings: Never    Marital Status: Married  Catering manager Violence: Not At Risk (03/31/2019)   Humiliation, Afraid, Rape, and Kick questionnaire    Fear of Current or Ex-Partner: No    Emotionally Abused: No    Physically Abused: No    Sexually Abused: No     Current Outpatient Medications:    albuterol (VENTOLIN HFA) 108 (90 Base) MCG/ACT inhaler, , Disp: , Rfl:    Cholecalciferol (D 1000) 25 MCG (1000 UT) capsule, Take by mouth., Disp: , Rfl:    loratadine (CLARITIN) 10 MG tablet, Take by mouth., Disp: , Rfl:    metoprolol succinate (TOPROL-XL) 25 MG 24 hr tablet, Take 25 mg by mouth daily., Disp: , Rfl:    Multiple Vitamin (MULTI-VITAMINS) TABS, Take by mouth., Disp: , Rfl:    pantoprazole (PROTONIX) 40 MG tablet, Take 40 mg by mouth daily., Disp: , Rfl:    norethindrone (AYGESTIN) 5 MG tablet, Take 1 tablet (5 mg total) by mouth daily., Disp: 90 tablet, Rfl: 3     ROS:  Review of Systems  Constitutional:  Negative for fatigue, fever and unexpected weight change.  Respiratory:  Negative for cough, shortness of breath and wheezing.   Cardiovascular:  Negative for chest pain, palpitations and leg swelling.  Gastrointestinal:  Positive for constipation and diarrhea. Negative for blood in stool, nausea and vomiting.  Endocrine: Negative for cold intolerance, heat intolerance and polyuria.  Genitourinary:  Positive for pelvic pain. Negative for dyspareunia, dysuria, flank pain, frequency, genital sores, hematuria, menstrual problem, urgency, vaginal bleeding, vaginal discharge and vaginal pain.  Musculoskeletal:  Positive for arthralgias. Negative for back pain, joint swelling and myalgias.  Skin:  Negative for rash.  Neurological:  Positive for headaches. Negative for dizziness, syncope, light-headedness and numbness.  Hematological:  Negative for adenopathy.  Psychiatric/Behavioral:   Negative for agitation, confusion, sleep disturbance and suicidal ideas. The patient is not nervous/anxious.    BREAST: No symptoms   Objective: BP 130/80   Ht 5\' 3"  (1.6 m)   Wt 246 lb (111.6 kg)   BMI 43.58 kg/m  HR=120 at exam  Physical Exam Constitutional:      Appearance: She is well-developed.  Genitourinary:     Vulva normal.     Right Labia: No rash, tenderness or lesions.    Left Labia: No tenderness, lesions or rash.    No vaginal discharge, erythema or tenderness.      Right Adnexa: not tender and no mass present.    Left Adnexa: not tender and no mass present.    No cervical friability or polyp.     Uterus is not enlarged or  tender.  Breasts:    Right: No mass, nipple discharge, skin change or tenderness.     Left: No mass, nipple discharge, skin change or tenderness.  Neck:     Thyroid: No thyromegaly.  Cardiovascular:     Rate and Rhythm: Regular rhythm. Tachycardia present.     Heart sounds: Normal heart sounds. No murmur heard. Pulmonary:     Effort: Pulmonary effort is normal.     Breath sounds: Normal breath sounds.  Abdominal:     Palpations: Abdomen is soft.     Tenderness: There is no abdominal tenderness. There is no guarding or rebound.    Musculoskeletal:        General: Normal range of motion.     Cervical back: Normal range of motion.  Lymphadenopathy:     Cervical: No cervical adenopathy.  Neurological:     General: No focal deficit present.     Mental Status: She is alert and oriented to person, place, and time.     Cranial Nerves: No cranial nerve deficit.  Skin:    General: Skin is warm and dry.  Psychiatric:        Mood and Affect: Mood normal.        Behavior: Behavior normal.        Thought Content: Thought content normal.        Judgment: Judgment normal.  Vitals reviewed.     Assessment/Plan: Encounter for annual routine gynecological examination  Encounter for surveillance of contraceptive pills - Plan:  norethindrone (AYGESTIN) 5 MG tablet; Rx RF eRxd.   RLQ abdominal pain - Plan: US PELVIS TRANSVAGINAL NON-OB (TV ONLY); check GYN u/s. If neg, question MSK. Hip/pelvis/LB stretch, exercise in meantime.   Endometriosis - Plan: norethindrone (AYGESTIN) 5 MG tablet, US PELVIS TRANSVAGINAL NON-OB (TV ONLY); controlled with POPs in past  Bilateral lower extremity edema--no pitting on exam today. D/C salt/sodium (can try Kcl instead), d/c caffeine, increase water, exercise, compression socks. F/u with PCP prn.   Meds ordered this encounter  Medications   norethindrone (AYGESTIN) 5 MG tablet    Sig: Take 1 tablet (5 mg total) by mouth daily.    Dispense:  90 tablet    Refill:  3    Order Specific Question:   Supervising Provider    Answer:   Waymon Budge             GYN counsel adequate intake of calcium and vitamin D, diet and exercise     F/U  Return in about 1 week (around 02/22/2023) for GYN u/s for RLQ pain--[facility] to call pt.  Breland Trouten B. Diannie Willner, PA-C 02/15/2023 1:55 PM

## 2023-02-15 ENCOUNTER — Ambulatory Visit (INDEPENDENT_AMBULATORY_CARE_PROVIDER_SITE_OTHER): Payer: 59 | Admitting: Obstetrics and Gynecology

## 2023-02-15 ENCOUNTER — Encounter: Payer: Self-pay | Admitting: Obstetrics and Gynecology

## 2023-02-15 VITALS — BP 130/80 | Ht 63.0 in | Wt 246.0 lb

## 2023-02-15 DIAGNOSIS — R600 Localized edema: Secondary | ICD-10-CM

## 2023-02-15 DIAGNOSIS — Z3041 Encounter for surveillance of contraceptive pills: Secondary | ICD-10-CM

## 2023-02-15 DIAGNOSIS — Z01411 Encounter for gynecological examination (general) (routine) with abnormal findings: Secondary | ICD-10-CM

## 2023-02-15 DIAGNOSIS — Z01419 Encounter for gynecological examination (general) (routine) without abnormal findings: Secondary | ICD-10-CM

## 2023-02-15 DIAGNOSIS — N809 Endometriosis, unspecified: Secondary | ICD-10-CM | POA: Insufficient documentation

## 2023-02-15 DIAGNOSIS — R1031 Right lower quadrant pain: Secondary | ICD-10-CM

## 2023-02-15 MED ORDER — NORETHINDRONE ACETATE 5 MG PO TABS
5.0000 mg | ORAL_TABLET | Freq: Every day | ORAL | 3 refills | Status: DC
Start: 2023-02-15 — End: 2024-04-13

## 2023-02-15 NOTE — Patient Instructions (Signed)
I value your feedback and you entrusting us with your care. If you get a [facility] patient survey, I would appreciate you taking the time to let us know about your experience today. Thank you! ? ? ?

## 2023-03-01 ENCOUNTER — Ambulatory Visit (INDEPENDENT_AMBULATORY_CARE_PROVIDER_SITE_OTHER): Payer: 59

## 2023-03-01 DIAGNOSIS — R1031 Right lower quadrant pain: Secondary | ICD-10-CM | POA: Diagnosis not present

## 2023-03-01 DIAGNOSIS — N809 Endometriosis, unspecified: Secondary | ICD-10-CM | POA: Diagnosis not present

## 2023-03-04 ENCOUNTER — Telehealth: Payer: Self-pay | Admitting: Obstetrics and Gynecology

## 2023-03-04 NOTE — Telephone Encounter (Signed)
Pt aware of neg GYN u/s results. Pt with hx of osis but doing aygestin daily. Question MSK etiology of random RLQ pains. Stretch/exercise. F/u prn.

## 2024-04-05 ENCOUNTER — Other Ambulatory Visit: Payer: Self-pay | Admitting: Obstetrics and Gynecology

## 2024-04-05 DIAGNOSIS — Z3041 Encounter for surveillance of contraceptive pills: Secondary | ICD-10-CM

## 2024-04-05 DIAGNOSIS — N809 Endometriosis, unspecified: Secondary | ICD-10-CM

## 2024-04-13 ENCOUNTER — Other Ambulatory Visit: Payer: Self-pay

## 2024-04-13 DIAGNOSIS — N809 Endometriosis, unspecified: Secondary | ICD-10-CM

## 2024-04-13 DIAGNOSIS — Z3041 Encounter for surveillance of contraceptive pills: Secondary | ICD-10-CM

## 2024-04-13 MED ORDER — NORETHINDRONE ACETATE 5 MG PO TABS
5.0000 mg | ORAL_TABLET | Freq: Every day | ORAL | 0 refills | Status: DC
Start: 2024-04-13 — End: 2024-04-24

## 2024-04-13 NOTE — Progress Notes (Signed)
 Appt 04-24-24

## 2024-04-23 NOTE — Progress Notes (Unsigned)
 PCP:  Patient, No Pcp Per   No chief complaint on file.    HPI:      Megan Norton is a 38 y.o. G2P2002 whose LMP was No LMP recorded. (Menstrual status: Oral contraceptives)., presents today for her annual examination.  Her menses are absent with aygestin  5 mg, no BTB/dysmen. Hx of endometriosis and migraines. Endometriosis pain was crampy suprapubic area. Having fleeting RLQ pains intermittently for the past 6 months. No aggrav factors. Sx are sharp, can happen a few in a row and then no more sx for a couple days/wks. No relation to cycle, IBS. Does have some hip pain, not exercising.   Sex activity: single partner, contraception - oral progesterone-only contraceptive. Has vag dryness/pain with sex, improved with lubricants. No change since before endometriosis dx.  Last Pap: 12/08/21 Results were: no abnormalities /neg HPV DNA   There is no FH of breast cancer. There is no FH of ovarian cancer. The patient does do self-breast exams.  Tobacco use: The patient denies current or previous tobacco use. Alcohol use: none No drug use.  Exercise: not active  She does get adequate calcium and Vitamin D in her diet.  Labs with PCP Having issues with LE edema bilat. Upper legs also hurt to touch. Is on her feet all day at work. Has tried compression hose without relief. Eats a lot of sodium/uses salt, drinks coffee/tea, not so much water. Sx worse with heat/humidity. No sx change on wknds vs when at work. Has bilat hip pain, not exercising. Worried about lymphedema. Normal CMP with PCP 1/24, does have mild pre-DM.  Past Medical History:  Diagnosis Date   Acid reflux    Chicken pox    Endometriosis    GERD (gastroesophageal reflux disease)    heartburn and indigestion   IBS (irritable bowel syndrome)    Migraines    Seasonal allergies    Tachycardia      Past Surgical History:  Procedure Laterality Date   APPENDECTOMY  10/09/2013   bowel obstruction  01/2006    ESOPHAGOGASTRODUODENOSCOPY N/A 02/25/2015   Procedure: ESOPHAGOGASTRODUODENOSCOPY (EGD);  Surgeon: Rogelia Copping, MD;  Location: [facility];  Service: Gastroenterology;  Laterality: N/A;    Family History  Problem Relation Age of Onset   Arthritis Mother    Glaucoma Mother    Hypothyroidism Mother    Arthritis Maternal Grandmother    Colon cancer Maternal Grandmother    Arthritis Maternal Grandfather    Arthritis Paternal Grandmother    Depression Paternal Grandmother    Arthritis Paternal Grandfather    Hypertension Paternal Grandfather     Social History   Socioeconomic History   Marital status: Married    Spouse name: Not on file   Number of children: Not on file   Years of education: Not on file   Highest education level: Not on file  Occupational History   Not on file  Tobacco Use   Smoking status: Never   Smokeless tobacco: Never  Vaping Use   Vaping status: Never Used  Substance and Sexual Activity   Alcohol use: No   Drug use: No   Sexual activity: Yes    Partners: Male    Birth control/protection: Pill  Other Topics Concern   Not on file  Social History Narrative   Not on file   Social Drivers of Health   Financial Resource Strain: Low Risk  (11/10/2023)   Received from [facility]   Overall Financial  Resource Strain (CARDIA)    Difficulty of Paying Living Expenses: Not hard at all  Food Insecurity: No Food Insecurity (11/10/2023)   Received from [facility]   Hunger Vital Sign    Within the past 12 months, you worried that your food would run out before you got the money to buy more.: Never true    Within the past 12 months, the food you bought just didn't last and you didn't have money to get more.: Never true  Transportation Needs: No Transportation Needs (11/10/2023)   Received from [facility] - Transportation    In the past 12 months, has lack of transportation kept you from  medical appointments or from getting medications?: No    Lack of Transportation (Non-Medical): No  Physical Activity: Inactive (03/31/2019)   Exercise Vital Sign    Days of Exercise per Week: 0 days    Minutes of Exercise per Session: 0 min  Stress: No Stress Concern Present (03/31/2019)   [employer] of Occupational Health - Occupational Stress Questionnaire    Feeling of Stress : Not at all  Social Connections: Moderately Integrated (03/31/2019)   Social Connection and Isolation Panel    Frequency of Communication with Friends and Family: More than three times a week    Frequency of Social Gatherings with Friends and Family: Once a week    Attends Religious Services: More than 4 times per year    Active Member of [employer] or Organizations: No    Attends Banker Meetings: Never    Marital Status: Married  Catering manager Violence: Not At Risk (03/31/2019)   Humiliation, Afraid, Rape, and Kick questionnaire    Fear of Current or Ex-Partner: No    Emotionally Abused: No    Physically Abused: No    Sexually Abused: No     Current Outpatient Medications:    albuterol (VENTOLIN HFA) 108 (90 Base) MCG/ACT inhaler, , Disp: , Rfl:    Cholecalciferol (D 1000) 25 MCG (1000 UT) capsule, Take by mouth., Disp: , Rfl:    loratadine (CLARITIN) 10 MG tablet, Take by mouth., Disp: , Rfl:    metoprolol succinate (TOPROL-XL) 25 MG 24 hr tablet, Take 25 mg by mouth daily., Disp: , Rfl:    Multiple Vitamin (MULTI-VITAMINS) TABS, Take by mouth., Disp: , Rfl:    norethindrone  (AYGESTIN ) 5 MG tablet, Take 1 tablet (5 mg total) by mouth daily., Disp: 90 tablet, Rfl: 0   pantoprazole (PROTONIX) 40 MG tablet, Take 40 mg by mouth daily., Disp: , Rfl:      ROS:  Review of Systems  Constitutional:  Negative for fatigue, fever and unexpected weight change.  Respiratory:  Negative for cough, shortness of breath and wheezing.   Cardiovascular:  Negative for chest pain, palpitations and leg  swelling.  Gastrointestinal:  Positive for constipation and diarrhea. Negative for blood in stool, nausea and vomiting.  Endocrine: Negative for cold intolerance, heat intolerance and polyuria.  Genitourinary:  Positive for pelvic pain. Negative for dyspareunia, dysuria, flank pain, frequency, genital sores, hematuria, menstrual problem, urgency, vaginal bleeding, vaginal discharge and vaginal pain.  Musculoskeletal:  Positive for arthralgias. Negative for back pain, joint swelling and myalgias.  Skin:  Negative for rash.  Neurological:  Positive for headaches. Negative for dizziness, syncope, light-headedness and numbness.  Hematological:  Negative for adenopathy.  Psychiatric/Behavioral:  Negative for agitation, confusion, sleep disturbance and suicidal ideas. The patient is not nervous/anxious.    BREAST:  No symptoms   Objective: There were no vitals taken for this visit. HR=120 at exam  Physical Exam Constitutional:      Appearance: She is well-developed.  Genitourinary:     Vulva normal.     Right Labia: No rash, tenderness or lesions.    Left Labia: No tenderness, lesions or rash.    No vaginal discharge, erythema or tenderness.      Right Adnexa: not tender and no mass present.    Left Adnexa: not tender and no mass present.    No cervical friability or polyp.     Uterus is not enlarged or tender.  Breasts:    Right: No mass, nipple discharge, skin change or tenderness.     Left: No mass, nipple discharge, skin change or tenderness.  Neck:     Thyroid : No thyromegaly.  Cardiovascular:     Rate and Rhythm: Regular rhythm. Tachycardia present.     Heart sounds: Normal heart sounds. No murmur heard. Pulmonary:     Effort: Pulmonary effort is normal.     Breath sounds: Normal breath sounds.  Abdominal:     Palpations: Abdomen is soft.     Tenderness: There is no abdominal tenderness. There is no guarding or rebound.   Musculoskeletal:        General: Normal range of  motion.     Cervical back: Normal range of motion.  Lymphadenopathy:     Cervical: No cervical adenopathy.  Neurological:     General: No focal deficit present.     Mental Status: She is alert and oriented to person, place, and time.     Cranial Nerves: No cranial nerve deficit.  Skin:    General: Skin is warm and dry.  Psychiatric:        Mood and Affect: Mood normal.        Behavior: Behavior normal.        Thought Content: Thought content normal.        Judgment: Judgment normal.  Vitals reviewed.     Assessment/Plan: Encounter for annual routine gynecological examination  Encounter for surveillance of contraceptive pills - Plan: norethindrone  (AYGESTIN ) 5 MG tablet; Rx RF eRxd.   RLQ abdominal pain - Plan: US  PELVIS TRANSVAGINAL NON-OB (TV ONLY); check GYN u/s. If neg, question MSK. Hip/pelvis/LB stretch, exercise in meantime.   Endometriosis - Plan: norethindrone  (AYGESTIN ) 5 MG tablet, US  PELVIS TRANSVAGINAL NON-OB (TV ONLY); controlled with POPs in past  Bilateral lower extremity edema--no pitting on exam today. D/C salt/sodium (can try Kcl instead), d/c caffeine, increase water, exercise, compression socks. F/u with PCP prn.   No orders of the defined types were placed in this encounter.            GYN counsel adequate intake of calcium and vitamin D, diet and exercise     F/U  No follow-ups on file.  Brin Ruggerio B. Presley Gora, PA-C 04/23/2024 2:41 PM

## 2024-04-24 ENCOUNTER — Ambulatory Visit (INDEPENDENT_AMBULATORY_CARE_PROVIDER_SITE_OTHER): Payer: Self-pay | Admitting: Obstetrics and Gynecology

## 2024-04-24 ENCOUNTER — Encounter: Payer: Self-pay | Admitting: Obstetrics and Gynecology

## 2024-04-24 VITALS — BP 116/77 | HR 93 | Ht 63.0 in | Wt 243.0 lb

## 2024-04-24 DIAGNOSIS — N809 Endometriosis, unspecified: Secondary | ICD-10-CM

## 2024-04-24 DIAGNOSIS — R232 Flushing: Secondary | ICD-10-CM | POA: Diagnosis not present

## 2024-04-24 DIAGNOSIS — Z3041 Encounter for surveillance of contraceptive pills: Secondary | ICD-10-CM | POA: Diagnosis not present

## 2024-04-24 DIAGNOSIS — Z1329 Encounter for screening for other suspected endocrine disorder: Secondary | ICD-10-CM

## 2024-04-24 DIAGNOSIS — Z131 Encounter for screening for diabetes mellitus: Secondary | ICD-10-CM

## 2024-04-24 DIAGNOSIS — Z01419 Encounter for gynecological examination (general) (routine) without abnormal findings: Secondary | ICD-10-CM | POA: Diagnosis not present

## 2024-04-24 MED ORDER — NORETHINDRONE ACETATE 5 MG PO TABS: 5.0000 mg | ORAL_TABLET | Freq: Every day | ORAL | 3 refills | Status: AC

## 2024-04-24 NOTE — Patient Instructions (Signed)
 I value your feedback and you entrusting [place] with your care. If you get a [facility] patient survey, I would appreciate you taking the time to let us know about your experience today. Thank you! ? ? ?

## 2024-04-25 ENCOUNTER — Ambulatory Visit: Payer: Self-pay | Admitting: Obstetrics and Gynecology

## 2024-04-25 DIAGNOSIS — E038 Other specified hypothyroidism: Secondary | ICD-10-CM

## 2024-04-25 LAB — HEMOGLOBIN A1C
Est. average glucose Bld gHb Est-mCnc: 114 mg/dL
Hgb A1c MFr Bld: 5.6 % (ref 4.8–5.6)

## 2024-04-25 LAB — T4, FREE: Free T4: 1.22 ng/dL (ref 0.82–1.77)

## 2024-04-25 LAB — TSH: TSH: 5.09 u[IU]/mL — ABNORMAL HIGH (ref 0.450–4.500)

## 2024-04-25 MED ORDER — LEVOTHYROXINE SODIUM 50 MCG PO TABS: 50.0000 ug | ORAL_TABLET | Freq: Every day | ORAL | 0 refills | Status: DC

## 2024-05-22 ENCOUNTER — Other Ambulatory Visit

## 2024-05-22 DIAGNOSIS — E038 Other specified hypothyroidism: Secondary | ICD-10-CM

## 2024-05-23 ENCOUNTER — Ambulatory Visit: Payer: Self-pay | Admitting: Obstetrics and Gynecology

## 2024-05-23 LAB — TSH+FREE T4
Free T4: 1.51 ng/dL (ref 0.82–1.77)
TSH: 2.78 u[IU]/mL (ref 0.450–4.500)

## 2024-05-25 ENCOUNTER — Other Ambulatory Visit: Payer: Self-pay | Admitting: Obstetrics and Gynecology

## 2024-05-25 DIAGNOSIS — E038 Other specified hypothyroidism: Secondary | ICD-10-CM

## 2024-05-25 MED ORDER — LEVOTHYROXINE SODIUM 50 MCG PO TABS: 50.0000 ug | ORAL_TABLET | Freq: Every day | ORAL | 2 refills | Status: AC

## 2024-05-25 NOTE — Progress Notes (Signed)
 Rx RF levo 50 mcg daily. Pt feeling better.
